# Patient Record
Sex: Female | Born: 1950 | Race: White | Hispanic: No | Marital: Married | Smoking: Never smoker
Health system: Southern US, Community
[De-identification: ages and names within clinical notes are randomized; demographics above are authoritative.]

## PROBLEM LIST (undated history)

## (undated) DIAGNOSIS — E785 Hyperlipidemia, unspecified: Secondary | ICD-10-CM

## (undated) DIAGNOSIS — G250 Essential tremor: Secondary | ICD-10-CM

## (undated) DIAGNOSIS — I4892 Unspecified atrial flutter: Secondary | ICD-10-CM

## (undated) DIAGNOSIS — I429 Cardiomyopathy, unspecified: Secondary | ICD-10-CM

## (undated) DIAGNOSIS — I2510 Atherosclerotic heart disease of native coronary artery without angina pectoris: Secondary | ICD-10-CM

## (undated) DIAGNOSIS — I509 Heart failure, unspecified: Secondary | ICD-10-CM

## (undated) DIAGNOSIS — F329 Major depressive disorder, single episode, unspecified: Secondary | ICD-10-CM

## (undated) DIAGNOSIS — F419 Anxiety disorder, unspecified: Secondary | ICD-10-CM

## (undated) DIAGNOSIS — N6019 Diffuse cystic mastopathy of unspecified breast: Secondary | ICD-10-CM

## (undated) DIAGNOSIS — D649 Anemia, unspecified: Secondary | ICD-10-CM

## (undated) DIAGNOSIS — I4891 Unspecified atrial fibrillation: Secondary | ICD-10-CM

## (undated) DIAGNOSIS — E079 Disorder of thyroid, unspecified: Secondary | ICD-10-CM

## (undated) DIAGNOSIS — E119 Type 2 diabetes mellitus without complications: Secondary | ICD-10-CM

## (undated) DIAGNOSIS — F32A Depression, unspecified: Secondary | ICD-10-CM

## (undated) DIAGNOSIS — I10 Essential (primary) hypertension: Secondary | ICD-10-CM

## (undated) DIAGNOSIS — I219 Acute myocardial infarction, unspecified: Secondary | ICD-10-CM

## (undated) HISTORY — DX: Unspecified atrial flutter: I48.92

## (undated) HISTORY — DX: Anemia, unspecified: D64.9

## (undated) HISTORY — DX: Disorder of thyroid, unspecified: E07.9

## (undated) HISTORY — DX: Essential (primary) hypertension: I10

## (undated) HISTORY — PX: CHOLECYSTECTOMY: SHX55

## (undated) HISTORY — DX: Atherosclerotic heart disease of native coronary artery without angina pectoris: I25.10

## (undated) HISTORY — DX: Major depressive disorder, single episode, unspecified: F32.9

## (undated) HISTORY — PX: CAROTID STENT: SHX1301

## (undated) HISTORY — DX: Hyperlipidemia, unspecified: E78.5

## (undated) HISTORY — DX: Diffuse cystic mastopathy of unspecified breast: N60.19

## (undated) HISTORY — DX: Essential tremor: G25.0

## (undated) HISTORY — DX: Unspecified atrial fibrillation: I48.91

## (undated) HISTORY — DX: Anxiety disorder, unspecified: F41.9

## (undated) HISTORY — PX: OTHER SURGICAL HISTORY: SHX169

## (undated) HISTORY — DX: Type 2 diabetes mellitus without complications: E11.9

## (undated) HISTORY — DX: Heart failure, unspecified: I50.9

## (undated) HISTORY — PX: TONSILLECTOMY: SUR1361

## (undated) HISTORY — DX: Depression, unspecified: F32.A

## (undated) HISTORY — PX: US BREAST LEFT (ARMC HISTORICAL): HXRAD1321

## (undated) HISTORY — DX: Cardiomyopathy, unspecified: I42.9

## (undated) HISTORY — PX: CORONARY ANGIOPLASTY: SHX604

## (undated) HISTORY — PX: INCISION AND DRAINAGE: SHX5863

## (undated) HISTORY — DX: Acute myocardial infarction, unspecified: I21.9

---

## 2003-02-17 HISTORY — PX: BARIATRIC SURGERY: SHX1103

## 2014-04-18 DIAGNOSIS — Z79899 Other long term (current) drug therapy: Secondary | ICD-10-CM | POA: Diagnosis not present

## 2014-04-18 DIAGNOSIS — I10 Essential (primary) hypertension: Secondary | ICD-10-CM | POA: Diagnosis not present

## 2014-04-18 DIAGNOSIS — E1165 Type 2 diabetes mellitus with hyperglycemia: Secondary | ICD-10-CM | POA: Diagnosis not present

## 2014-04-18 DIAGNOSIS — E039 Hypothyroidism, unspecified: Secondary | ICD-10-CM | POA: Diagnosis not present

## 2014-04-18 DIAGNOSIS — E785 Hyperlipidemia, unspecified: Secondary | ICD-10-CM | POA: Diagnosis not present

## 2014-09-26 DIAGNOSIS — Z79899 Other long term (current) drug therapy: Secondary | ICD-10-CM | POA: Diagnosis not present

## 2014-09-26 DIAGNOSIS — E785 Hyperlipidemia, unspecified: Secondary | ICD-10-CM | POA: Diagnosis not present

## 2014-09-26 DIAGNOSIS — I10 Essential (primary) hypertension: Secondary | ICD-10-CM | POA: Diagnosis not present

## 2014-09-26 DIAGNOSIS — E1165 Type 2 diabetes mellitus with hyperglycemia: Secondary | ICD-10-CM | POA: Diagnosis not present

## 2014-09-26 DIAGNOSIS — E039 Hypothyroidism, unspecified: Secondary | ICD-10-CM | POA: Diagnosis not present

## 2014-09-26 DIAGNOSIS — B372 Candidiasis of skin and nail: Secondary | ICD-10-CM | POA: Diagnosis not present

## 2014-11-02 DIAGNOSIS — E119 Type 2 diabetes mellitus without complications: Secondary | ICD-10-CM

## 2014-11-02 DIAGNOSIS — E785 Hyperlipidemia, unspecified: Secondary | ICD-10-CM

## 2014-11-02 DIAGNOSIS — I429 Cardiomyopathy, unspecified: Secondary | ICD-10-CM | POA: Insufficient documentation

## 2014-11-02 DIAGNOSIS — I2510 Atherosclerotic heart disease of native coronary artery without angina pectoris: Secondary | ICD-10-CM | POA: Insufficient documentation

## 2014-11-02 DIAGNOSIS — Z4502 Encounter for adjustment and management of automatic implantable cardiac defibrillator: Secondary | ICD-10-CM

## 2014-11-02 HISTORY — DX: Encounter for adjustment and management of automatic implantable cardiac defibrillator: Z45.02

## 2014-11-02 HISTORY — DX: Atherosclerotic heart disease of native coronary artery without angina pectoris: I25.10

## 2014-11-02 HISTORY — DX: Hyperlipidemia, unspecified: E78.5

## 2014-11-02 HISTORY — DX: Type 2 diabetes mellitus without complications: E11.9

## 2014-12-07 DIAGNOSIS — Z23 Encounter for immunization: Secondary | ICD-10-CM | POA: Diagnosis not present

## 2014-12-19 DIAGNOSIS — S62101A Fracture of unspecified carpal bone, right wrist, initial encounter for closed fracture: Secondary | ICD-10-CM | POA: Diagnosis not present

## 2014-12-21 DIAGNOSIS — S52501A Unspecified fracture of the lower end of right radius, initial encounter for closed fracture: Secondary | ICD-10-CM | POA: Diagnosis not present

## 2015-01-25 DIAGNOSIS — S52571D Other intraarticular fracture of lower end of right radius, subsequent encounter for closed fracture with routine healing: Secondary | ICD-10-CM | POA: Diagnosis not present

## 2015-02-13 DIAGNOSIS — M9902 Segmental and somatic dysfunction of thoracic region: Secondary | ICD-10-CM | POA: Diagnosis not present

## 2015-02-13 DIAGNOSIS — M9901 Segmental and somatic dysfunction of cervical region: Secondary | ICD-10-CM | POA: Diagnosis not present

## 2015-02-13 DIAGNOSIS — M542 Cervicalgia: Secondary | ICD-10-CM | POA: Diagnosis not present

## 2015-03-15 DIAGNOSIS — E039 Hypothyroidism, unspecified: Secondary | ICD-10-CM | POA: Diagnosis not present

## 2015-03-15 DIAGNOSIS — E785 Hyperlipidemia, unspecified: Secondary | ICD-10-CM | POA: Diagnosis not present

## 2015-03-15 DIAGNOSIS — E1165 Type 2 diabetes mellitus with hyperglycemia: Secondary | ICD-10-CM | POA: Diagnosis not present

## 2015-03-15 DIAGNOSIS — Z79899 Other long term (current) drug therapy: Secondary | ICD-10-CM | POA: Diagnosis not present

## 2015-03-15 DIAGNOSIS — I10 Essential (primary) hypertension: Secondary | ICD-10-CM | POA: Diagnosis not present

## 2015-07-16 DIAGNOSIS — Z79899 Other long term (current) drug therapy: Secondary | ICD-10-CM | POA: Diagnosis not present

## 2015-07-16 DIAGNOSIS — I10 Essential (primary) hypertension: Secondary | ICD-10-CM | POA: Diagnosis not present

## 2015-07-16 DIAGNOSIS — E785 Hyperlipidemia, unspecified: Secondary | ICD-10-CM | POA: Diagnosis not present

## 2015-07-16 DIAGNOSIS — E119 Type 2 diabetes mellitus without complications: Secondary | ICD-10-CM | POA: Diagnosis not present

## 2015-07-16 DIAGNOSIS — E039 Hypothyroidism, unspecified: Secondary | ICD-10-CM | POA: Diagnosis not present

## 2015-07-24 DIAGNOSIS — L570 Actinic keratosis: Secondary | ICD-10-CM | POA: Diagnosis not present

## 2015-07-24 DIAGNOSIS — L821 Other seborrheic keratosis: Secondary | ICD-10-CM | POA: Diagnosis not present

## 2015-07-24 DIAGNOSIS — D225 Melanocytic nevi of trunk: Secondary | ICD-10-CM | POA: Diagnosis not present

## 2015-08-27 DIAGNOSIS — E039 Hypothyroidism, unspecified: Secondary | ICD-10-CM | POA: Diagnosis not present

## 2015-10-18 ENCOUNTER — Other Ambulatory Visit: Payer: Self-pay

## 2015-10-25 DIAGNOSIS — Z23 Encounter for immunization: Secondary | ICD-10-CM | POA: Diagnosis not present

## 2015-11-05 DIAGNOSIS — E119 Type 2 diabetes mellitus without complications: Secondary | ICD-10-CM | POA: Diagnosis not present

## 2015-11-05 DIAGNOSIS — E785 Hyperlipidemia, unspecified: Secondary | ICD-10-CM | POA: Diagnosis not present

## 2015-11-05 DIAGNOSIS — I255 Ischemic cardiomyopathy: Secondary | ICD-10-CM | POA: Diagnosis not present

## 2015-11-05 DIAGNOSIS — Z4502 Encounter for adjustment and management of automatic implantable cardiac defibrillator: Secondary | ICD-10-CM | POA: Diagnosis not present

## 2015-11-05 DIAGNOSIS — I2510 Atherosclerotic heart disease of native coronary artery without angina pectoris: Secondary | ICD-10-CM | POA: Diagnosis not present

## 2015-11-12 DIAGNOSIS — E039 Hypothyroidism, unspecified: Secondary | ICD-10-CM | POA: Diagnosis not present

## 2015-11-12 DIAGNOSIS — E785 Hyperlipidemia, unspecified: Secondary | ICD-10-CM | POA: Diagnosis not present

## 2015-11-12 DIAGNOSIS — I10 Essential (primary) hypertension: Secondary | ICD-10-CM | POA: Diagnosis not present

## 2015-11-12 DIAGNOSIS — D539 Nutritional anemia, unspecified: Secondary | ICD-10-CM | POA: Diagnosis not present

## 2015-11-12 DIAGNOSIS — Z79899 Other long term (current) drug therapy: Secondary | ICD-10-CM | POA: Diagnosis not present

## 2015-11-12 DIAGNOSIS — E119 Type 2 diabetes mellitus without complications: Secondary | ICD-10-CM | POA: Diagnosis not present

## 2015-11-19 DIAGNOSIS — I255 Ischemic cardiomyopathy: Secondary | ICD-10-CM | POA: Diagnosis not present

## 2015-11-19 DIAGNOSIS — E119 Type 2 diabetes mellitus without complications: Secondary | ICD-10-CM | POA: Diagnosis not present

## 2015-11-19 DIAGNOSIS — I2510 Atherosclerotic heart disease of native coronary artery without angina pectoris: Secondary | ICD-10-CM | POA: Diagnosis not present

## 2015-11-19 DIAGNOSIS — E785 Hyperlipidemia, unspecified: Secondary | ICD-10-CM | POA: Diagnosis not present

## 2015-11-19 DIAGNOSIS — Z4502 Encounter for adjustment and management of automatic implantable cardiac defibrillator: Secondary | ICD-10-CM | POA: Diagnosis not present

## 2015-11-22 DIAGNOSIS — E119 Type 2 diabetes mellitus without complications: Secondary | ICD-10-CM | POA: Diagnosis not present

## 2015-12-17 DIAGNOSIS — Z4502 Encounter for adjustment and management of automatic implantable cardiac defibrillator: Secondary | ICD-10-CM | POA: Diagnosis not present

## 2015-12-17 DIAGNOSIS — I2510 Atherosclerotic heart disease of native coronary artery without angina pectoris: Secondary | ICD-10-CM | POA: Diagnosis not present

## 2015-12-17 DIAGNOSIS — E119 Type 2 diabetes mellitus without complications: Secondary | ICD-10-CM | POA: Diagnosis not present

## 2015-12-17 DIAGNOSIS — E785 Hyperlipidemia, unspecified: Secondary | ICD-10-CM | POA: Diagnosis not present

## 2015-12-17 DIAGNOSIS — I255 Ischemic cardiomyopathy: Secondary | ICD-10-CM | POA: Diagnosis not present

## 2016-03-20 DIAGNOSIS — R05 Cough: Secondary | ICD-10-CM | POA: Diagnosis not present

## 2016-03-20 DIAGNOSIS — E119 Type 2 diabetes mellitus without complications: Secondary | ICD-10-CM | POA: Diagnosis not present

## 2016-03-20 DIAGNOSIS — E785 Hyperlipidemia, unspecified: Secondary | ICD-10-CM | POA: Diagnosis not present

## 2016-03-20 DIAGNOSIS — Z23 Encounter for immunization: Secondary | ICD-10-CM | POA: Diagnosis not present

## 2016-03-20 DIAGNOSIS — E611 Iron deficiency: Secondary | ICD-10-CM | POA: Diagnosis not present

## 2016-03-20 DIAGNOSIS — Z79899 Other long term (current) drug therapy: Secondary | ICD-10-CM | POA: Diagnosis not present

## 2016-03-20 DIAGNOSIS — E039 Hypothyroidism, unspecified: Secondary | ICD-10-CM | POA: Diagnosis not present

## 2016-03-20 DIAGNOSIS — I10 Essential (primary) hypertension: Secondary | ICD-10-CM | POA: Diagnosis not present

## 2016-04-14 DIAGNOSIS — Z1231 Encounter for screening mammogram for malignant neoplasm of breast: Secondary | ICD-10-CM | POA: Diagnosis not present

## 2016-04-14 DIAGNOSIS — R928 Other abnormal and inconclusive findings on diagnostic imaging of breast: Secondary | ICD-10-CM | POA: Diagnosis not present

## 2016-05-18 DIAGNOSIS — I2510 Atherosclerotic heart disease of native coronary artery without angina pectoris: Secondary | ICD-10-CM | POA: Diagnosis not present

## 2016-05-18 DIAGNOSIS — E119 Type 2 diabetes mellitus without complications: Secondary | ICD-10-CM | POA: Diagnosis not present

## 2016-05-18 DIAGNOSIS — Z95810 Presence of automatic (implantable) cardiac defibrillator: Secondary | ICD-10-CM

## 2016-05-18 DIAGNOSIS — E785 Hyperlipidemia, unspecified: Secondary | ICD-10-CM | POA: Diagnosis not present

## 2016-05-18 DIAGNOSIS — I255 Ischemic cardiomyopathy: Secondary | ICD-10-CM | POA: Diagnosis not present

## 2016-05-18 HISTORY — DX: Presence of automatic (implantable) cardiac defibrillator: Z95.810

## 2016-05-21 DIAGNOSIS — N6489 Other specified disorders of breast: Secondary | ICD-10-CM | POA: Diagnosis not present

## 2016-05-21 DIAGNOSIS — R928 Other abnormal and inconclusive findings on diagnostic imaging of breast: Secondary | ICD-10-CM | POA: Diagnosis not present

## 2016-05-21 DIAGNOSIS — N6311 Unspecified lump in the right breast, upper outer quadrant: Secondary | ICD-10-CM | POA: Diagnosis not present

## 2016-06-12 DIAGNOSIS — L814 Other melanin hyperpigmentation: Secondary | ICD-10-CM | POA: Diagnosis not present

## 2016-06-12 DIAGNOSIS — R233 Spontaneous ecchymoses: Secondary | ICD-10-CM | POA: Diagnosis not present

## 2016-06-12 DIAGNOSIS — D2239 Melanocytic nevi of other parts of face: Secondary | ICD-10-CM | POA: Diagnosis not present

## 2016-06-12 DIAGNOSIS — L570 Actinic keratosis: Secondary | ICD-10-CM | POA: Diagnosis not present

## 2016-06-12 DIAGNOSIS — D225 Melanocytic nevi of trunk: Secondary | ICD-10-CM | POA: Diagnosis not present

## 2016-07-20 DIAGNOSIS — E785 Hyperlipidemia, unspecified: Secondary | ICD-10-CM | POA: Diagnosis not present

## 2016-07-20 DIAGNOSIS — J029 Acute pharyngitis, unspecified: Secondary | ICD-10-CM | POA: Diagnosis not present

## 2016-07-20 DIAGNOSIS — Z79899 Other long term (current) drug therapy: Secondary | ICD-10-CM | POA: Diagnosis not present

## 2016-07-20 DIAGNOSIS — E119 Type 2 diabetes mellitus without complications: Secondary | ICD-10-CM | POA: Diagnosis not present

## 2016-08-31 DIAGNOSIS — R21 Rash and other nonspecific skin eruption: Secondary | ICD-10-CM | POA: Diagnosis not present

## 2016-08-31 DIAGNOSIS — E039 Hypothyroidism, unspecified: Secondary | ICD-10-CM | POA: Diagnosis not present

## 2016-08-31 DIAGNOSIS — I5020 Unspecified systolic (congestive) heart failure: Secondary | ICD-10-CM | POA: Diagnosis not present

## 2016-08-31 DIAGNOSIS — I10 Essential (primary) hypertension: Secondary | ICD-10-CM | POA: Diagnosis not present

## 2016-08-31 DIAGNOSIS — E119 Type 2 diabetes mellitus without complications: Secondary | ICD-10-CM | POA: Diagnosis not present

## 2016-08-31 DIAGNOSIS — R05 Cough: Secondary | ICD-10-CM | POA: Diagnosis not present

## 2016-11-04 DIAGNOSIS — R05 Cough: Secondary | ICD-10-CM | POA: Diagnosis not present

## 2016-11-04 DIAGNOSIS — E039 Hypothyroidism, unspecified: Secondary | ICD-10-CM | POA: Diagnosis not present

## 2016-11-04 DIAGNOSIS — I5020 Unspecified systolic (congestive) heart failure: Secondary | ICD-10-CM | POA: Diagnosis not present

## 2016-11-04 DIAGNOSIS — I10 Essential (primary) hypertension: Secondary | ICD-10-CM | POA: Diagnosis not present

## 2016-11-04 DIAGNOSIS — Z79899 Other long term (current) drug therapy: Secondary | ICD-10-CM | POA: Diagnosis not present

## 2016-11-04 DIAGNOSIS — R799 Abnormal finding of blood chemistry, unspecified: Secondary | ICD-10-CM | POA: Diagnosis not present

## 2016-11-04 DIAGNOSIS — E785 Hyperlipidemia, unspecified: Secondary | ICD-10-CM | POA: Diagnosis not present

## 2016-11-18 ENCOUNTER — Ambulatory Visit: Payer: Medicare Other | Admitting: Cardiology

## 2016-12-15 ENCOUNTER — Encounter: Payer: Self-pay | Admitting: Cardiology

## 2016-12-15 ENCOUNTER — Ambulatory Visit (INDEPENDENT_AMBULATORY_CARE_PROVIDER_SITE_OTHER): Payer: Medicare Other | Admitting: Cardiology

## 2016-12-15 VITALS — BP 122/70 | HR 92 | Resp 17 | Ht 63.0 in | Wt 201.0 lb

## 2016-12-15 DIAGNOSIS — I255 Ischemic cardiomyopathy: Secondary | ICD-10-CM

## 2016-12-15 DIAGNOSIS — I2510 Atherosclerotic heart disease of native coronary artery without angina pectoris: Secondary | ICD-10-CM | POA: Diagnosis not present

## 2016-12-15 DIAGNOSIS — Z95810 Presence of automatic (implantable) cardiac defibrillator: Secondary | ICD-10-CM

## 2016-12-15 DIAGNOSIS — E785 Hyperlipidemia, unspecified: Secondary | ICD-10-CM | POA: Diagnosis not present

## 2016-12-15 DIAGNOSIS — Z794 Long term (current) use of insulin: Secondary | ICD-10-CM

## 2016-12-15 DIAGNOSIS — E119 Type 2 diabetes mellitus without complications: Secondary | ICD-10-CM

## 2016-12-15 MED ORDER — TELMISARTAN 40 MG PO TABS: 40.0000 mg | ORAL_TABLET | Freq: Every day | ORAL | 6 refills | Status: DC

## 2016-12-15 MED ORDER — LOSARTAN POTASSIUM 25 MG PO TABS: 25.0000 mg | ORAL_TABLET | Freq: Every day | ORAL | 11 refills | Status: DC

## 2016-12-15 NOTE — Patient Instructions (Signed)
Medication Instructions:  Your physician has recommended you make the following change in your medication:  1.) START Losartan 25 mg daily.   1. Avoid all over-the-counter antihistamines except Claritin/Loratadine and Zyrtec/Cetrizine. 2. Avoid all combination including cold sinus allergies flu decongestant and sleep medications 3. You can use Robitussin DM Mucinex and Mucinex DM for cough. 4. can use Tylenol aspirin ibuprofen and naproxen but no combinations such as sleep or sinus.  Labwork: Your physician recommends that you have lab work today to check your cholesterol and Kidney function as well as sodium level and potassium.   Testing/Procedures: None   Follow-Up: Your physician recommends that you schedule a follow-up appointment in: 2-3 months   Any Other Special Instructions Will Be Listed Below (If Applicable).  Please note that any paperwork needing to be filled out by the provider will need to be addressed at the front desk prior to seeing the provider. Please note that any paperwork FMLA, Disability or other documents regarding health condition is subject to a $25.00 charge that must be received prior to completion of paperwork in the form of a money order or check.    If you need a refill on your cardiac medications before your next appointment, please call your pharmacy.

## 2016-12-16 LAB — BASIC METABOLIC PANEL
BUN/Creatinine Ratio: 20 (ref 12–28)
BUN: 25 mg/dL (ref 8–27)
CALCIUM: 9.4 mg/dL (ref 8.7–10.3)
CO2: 27 mmol/L (ref 20–29)
CREATININE: 1.27 mg/dL — AB (ref 0.57–1.00)
Chloride: 98 mmol/L (ref 96–106)
GFR calc Af Amer: 50 mL/min/{1.73_m2} — ABNORMAL LOW (ref >59)
GFR, EST NON AFRICAN AMERICAN: 44 mL/min/{1.73_m2} — AB (ref >59)
GLUCOSE: 157 mg/dL — AB (ref 65–99)
POTASSIUM: 5.2 mmol/L (ref 3.5–5.2)
Sodium: 139 mmol/L (ref 134–144)

## 2016-12-16 NOTE — Progress Notes (Signed)
Cardiology Office Note:    Date:  12/16/2016   ID:  Christina Perry, DOB 09/04/1950, MRN [id number]  PCP:  Ernestene Kiel, MD  Cardiologist:  Jenne Campus, MD    Referring MD: Ernestene Kiel, MD   Chief Complaint  Patient presents with  . Follow-up    routine follow up   Doing well  History of Present Illness:    Christina Perry is a 66 y.o. female with history of ischemic cardiomyopathy she did have myocardial infarction at age of 25.  She seems to be doing well.  Denies having any chest pains tightness squeezing pressure burning chest.  She comes for regular follow-up.  She still works and had no difficulty doing it.  She still have some difficulty controlling her diabetes and being always a problem with it.  Does not exercise on a regular basis  Past Medical History:  Diagnosis Date  . Anxiety   . Cardiomyopathy ([place])   . Coronary artery stenosis   . Depression   . Diabetes mellitus without complication ([place])   . Hyperlipidemia   . Hypertension   . MI (myocardial infarction) ([place])   . Thyroid disease     Past Surgical History:  Procedure Laterality Date  . CAROTID STENT    . CESAREAN SECTION    . CORONARY ANGIOPLASTY    . ICD Placement      Current Medications: Current Meds  Medication Sig  . aspirin (GOODSENSE ASPIRIN) 325 MG tablet Take 325 mg by mouth daily.  Marland Kitchen atorvastatin (LIPITOR) 20 MG tablet Take 20 mg by mouth daily.  . carvedilol (COREG) 12.5 MG tablet Take 6.25 mg by mouth 2 (two) times daily.  . furosemide (LASIX) 40 MG tablet Take 20 mg by mouth as needed. 20mg  Mon and Thursday   . glimepiride (AMARYL) 4 MG tablet Take 4 mg by mouth daily.  Marland Kitchen levothyroxine (SYNTHROID, LEVOTHROID) 137 MCG tablet Take 137 mcg by mouth daily.  . Magnesium 500 MG TABS Take 500 mg by mouth 2 (two) times daily.  . Melatonin 3 MG TABS Take 1 tablet by mouth as needed for sleep.  . metFORMIN (GLUCOPHAGE) 1000 MG tablet Take 1,000 mg by mouth 2 (two) times daily.    . montelukast (SINGULAIR) 10 MG tablet   . Multiple Vitamin (MULTI-VITAMINS) TABS Take 1 tablet by mouth daily.  Marland Kitchen olmesartan (BENICAR) 20 MG tablet Take 20 mg by mouth daily.  Marland Kitchen PARoxetine (PAXIL) 20 MG tablet Take 20 mg by mouth daily.  . vitamin E 400 UNIT capsule Take 1 capsule by mouth daily.     Allergies:   Patient has no allergy information on record.   Social History   Social History  . Marital status: Married    Spouse name: N/A  . Number of children: N/A  . Years of education: N/A   Social History Main Topics  . Smoking status: Never Smoker  . Smokeless tobacco: Never Used  . Alcohol use Yes  . Drug use: No  . Sexual activity: Not Asked   Other Topics Concern  . None   Social History Narrative  . None     Family History: The patient's family history includes CAD in her mother; COPD in her mother; Diabetes in her mother; Stroke in her mother. ROS:   Please see the history of present illness.    All 14 point review of systems negative except as described per history of present illness  EKGs/Labs/Other Studies Reviewed:  Recent Labs: 12/15/2016: BUN 25; Creatinine, Ser 1.27; Potassium 5.2; Sodium 139  Recent Lipid Panel No results found for: CHOL, TRIG, HDL, CHOLHDL, VLDL, LDLCALC, LDLDIRECT  Physical Exam:    VS:  BP 122/70 (BP Location: Right Arm, Patient Position: Sitting, Cuff Size: Large)   Pulse 92   Resp 17   Ht 5\' 3"  (1.6 m)   Wt 201 lb (91.2 kg)   SpO2 96%   BMI 35.61 kg/m     Wt Readings from Last 3 Encounters:  12/15/16 201 lb (91.2 kg)     GEN:  Well nourished, well developed in no acute distress HEENT: Normal NECK: No JVD; No carotid bruits LYMPHATICS: No lymphadenopathy CARDIAC: RRR, no murmurs, no rubs, no gallops RESPIRATORY:  Clear to auscultation without rales, wheezing or rhonchi  ABDOMEN: Soft, non-tender, non-distended MUSCULOSKELETAL:  No edema; No deformity  SKIN: Warm and dry LOWER EXTREMITIES: no  swelling NEUROLOGIC:  Alert and oriented x 3 PSYCHIATRIC:  Normal affect   ASSESSMENT:    1. Coronary artery disease involving native coronary artery of native heart without angina pectoris   2. Ischemic cardiomyopathy   3. Type 2 diabetes mellitus without complication, with long-term current use of insulin ([place])   4. Dyslipidemia   5. ICD (implantable cardioverter-defibrillator) in place    PLAN:    In order of problems listed above:  1. Coronary artery disease: Stable, asymptomatic we will continue present management. 2. Ischemic cardiomyopathy: She is on beta-blocker however she stopped taking her ARB.  She is unable to tolerate ACE inhibitor.  We will try to start a different ARB because she wants to try Micardis.  He may be candidate for Entresto 3. Type 2 diabetes: Always difficult to control.  She is trying to do better.  Followed by internal medicine team. 4. Dyslipidemia: On statin which I will continue. 5. ICD present: Battery status getting low will contact company to see if it is time to talk about replacement.   Medication Adjustments/Labs and Tests Ordered: Current medicines are reviewed at length with the patient today.  Concerns regarding medicines are outlined above.  Orders Placed This Encounter  Procedures  . Basic metabolic panel   Medication changes:  Meds ordered this encounter  Medications  . DISCONTD: losartan (COZAAR) 25 MG tablet    Sig: Take 1 tablet (25 mg total) by mouth daily.    Dispense:  30 tablet    Refill:  11  . telmisartan (MICARDIS) 40 MG tablet    Sig: Take 1 tablet (40 mg total) by mouth daily.    Dispense:  30 tablet    Refill:  6    Signed, Park Liter, MD, [facility] 12/16/2016 8:25 AM    [place]

## 2016-12-29 ENCOUNTER — Other Ambulatory Visit: Payer: Self-pay | Admitting: Cardiology

## 2017-01-06 ENCOUNTER — Encounter: Payer: Self-pay | Admitting: Cardiology

## 2017-01-06 ENCOUNTER — Ambulatory Visit (INDEPENDENT_AMBULATORY_CARE_PROVIDER_SITE_OTHER): Payer: Medicare Other | Admitting: Cardiology

## 2017-01-06 VITALS — BP 128/79 | HR 60 | Ht 63.0 in | Wt 204.4 lb

## 2017-01-06 DIAGNOSIS — I255 Ischemic cardiomyopathy: Secondary | ICD-10-CM | POA: Diagnosis not present

## 2017-01-06 DIAGNOSIS — I2510 Atherosclerotic heart disease of native coronary artery without angina pectoris: Secondary | ICD-10-CM

## 2017-01-06 DIAGNOSIS — E785 Hyperlipidemia, unspecified: Secondary | ICD-10-CM | POA: Diagnosis not present

## 2017-01-06 DIAGNOSIS — I10 Essential (primary) hypertension: Secondary | ICD-10-CM | POA: Diagnosis not present

## 2017-01-06 DIAGNOSIS — Z79899 Other long term (current) drug therapy: Secondary | ICD-10-CM | POA: Diagnosis not present

## 2017-01-06 NOTE — Addendum Note (Signed)
Addended by: [place] Kidney on: 01/06/2017 03:52 PM   Modules accepted: Orders

## 2017-01-06 NOTE — Patient Instructions (Signed)
Medication Instructions:  Your physician recommends that you continue on your current medications as directed. Please refer to the Current Medication list given to you today.  * If you need a refill on your cardiac medications before your next appointment, please call your pharmacy. *  Labwork: bmet today  Testing/Procedures: None ordered  Follow-Up: Call the office when your device notifies you that it has reached end of battery life Call [phone]  Thank you for choosing [facility]!!   Trinidad Curet, RN [phone]  Any Other Special Instructions Will Be Listed Below (If Applicable).

## 2017-01-06 NOTE — Progress Notes (Signed)
Electrophysiology Office Note   Date:  01/06/2017   ID:  Christina Perry, DOB 19-Dec-1950, MRN [id number]  PCP:  Ernestene Kiel, MD  Cardiologist:  Agustin Cree Primary Electrophysiologist:  Constance Haw, MD    Chief Complaint  Patient presents with  . Defib Check    Ischemic cardiomyopathy     History of Present Illness: Christina Perry is a 66 y.o. female who is being seen today for the evaluation of ischemic cardiomyopathy at the request of Ernestene Kiel, MD. Presenting today for electrophysiology evaluation.  She has a history of an ischemic cardiomyopathy with a myocardial infarction at the age of 32, type 2 diabetes, hypertension, and hyperlipidemia.  She presents for evaluation of her ICD.    Today, she denies symptoms of palpitations, chest pain, shortness of breath, orthopnea, PND, lower extremity edema, claudication, dizziness, presyncope, syncope, bleeding, or neurologic sequela. The patient is tolerating medications without difficulties.  He currently feels well without major complaint.  Her ICD is close to RRT.  She does not currently do remote checks.   Past Medical History:  Diagnosis Date  . Anxiety   . Cardiomyopathy ([place])   . Coronary artery stenosis   . Depression   . Diabetes mellitus without complication ([place])   . Hyperlipidemia   . Hypertension   . MI (myocardial infarction) ([place])   . Thyroid disease    Past Surgical History:  Procedure Laterality Date  . CAROTID STENT    . CESAREAN SECTION    . CORONARY ANGIOPLASTY    . ICD Placement       Current Outpatient Medications  Medication Sig Dispense Refill  . aspirin (GOODSENSE ASPIRIN) 325 MG tablet Take 325 mg by mouth daily.    Marland Kitchen atorvastatin (LIPITOR) 20 MG tablet Take 20 mg by mouth daily.    . carvedilol (COREG) 12.5 MG tablet Take 12.5 mg by mouth 2 (two) times daily.     . furosemide (LASIX) 40 MG tablet Take 20 mg by mouth as needed. 20mg  Mon and Thursday     . glimepiride  (AMARYL) 4 MG tablet Take 4 mg by mouth daily.    Marland Kitchen levothyroxine (SYNTHROID, LEVOTHROID) 137 MCG tablet Take 137 mcg by mouth daily.    . Magnesium 500 MG TABS Take 500 mg by mouth 2 (two) times daily.    . Melatonin 3 MG TABS Take 1 tablet by mouth as needed for sleep.    . metFORMIN (GLUCOPHAGE) 1000 MG tablet Take 1,000 mg by mouth 2 (two) times daily.    . montelukast (SINGULAIR) 10 MG tablet Take 10 mg by mouth at bedtime.     . Multiple Vitamin (MULTI-VITAMINS) TABS Take 1 tablet by mouth daily.    Marland Kitchen PARoxetine (PAXIL) 20 MG tablet Take 20 mg by mouth daily.    . Telmisartan (MICARDIS PO) Take 10 mg by mouth daily.    . vitamin E 400 UNIT capsule Take 1 capsule by mouth daily.     No current facility-administered medications for this visit.     Allergies:   Patient has no allergy information on record.   Social History:  The patient  reports that  has never smoked. she has never used smokeless tobacco. She reports that she drinks alcohol. She reports that she does not use drugs.   Family History:  The patient's family history includes CAD in her mother; COPD in her mother; Diabetes in her mother; Stroke in her mother.    ROS:  Please see  the history of present illness.   Otherwise, review of systems is positive for none.   All other systems are reviewed and negative.    PHYSICAL EXAM: VS:  BP 128/79   Pulse 60   Ht 5\' 3"  (1.6 m)   Wt 204 lb 6.4 oz (92.7 kg)   BMI 36.21 kg/m  , BMI Body mass index is 36.21 kg/m. GEN: Well nourished, well developed, in no acute distress  HEENT: normal  Neck: no JVD, carotid bruits, or masses Cardiac: RRR; no murmurs, rubs, or gallops,no edema  Respiratory:  clear to auscultation bilaterally, normal work of breathing GI: soft, nontender, nondistended, + BS MS: no deformity or atrophy  Skin: warm and dry,  device pocket is well healed Neuro:  Strength and sensation are intact Psych: euthymic mood, full affect  EKG:  EKG is ordered  today. Personal review of the ekg ordered shows A paced, LAD, rate 60   Device interrogation is reviewed today in detail.  See PaceArt for details.   Recent Labs: 12/15/2016: BUN 25; Creatinine, Ser 1.27; Potassium 5.2; Sodium 139    Lipid Panel  No results found for: CHOL, TRIG, HDL, CHOLHDL, VLDL, LDLCALC, LDLDIRECT   Wt Readings from Last 3 Encounters:  01/06/17 204 lb 6.4 oz (92.7 kg)  12/15/16 201 lb (91.2 kg)      Other studies Reviewed: Additional studies/ records that were reviewed today include: Epic notes  ASSESSMENT AND PLAN:  1.  Systolic heart failure chronic due to ischemic cardiomyopathy: Currently on optimal medical therapy.  Is status post Medtronic ICD.  She is close to needing a generator change.  She does not do remote checks.  She was made aware of the alert tone on her device and Elexus Barman call us when it goes off to schedule.  2.  Hypertension: Currently well controlled.  No changes.  3.  Hyperlipidemia: Continue statin  4.  Coronary artery disease: Has been stable.  Is asymptomatic.  We Maveryk Renstrom continue current management.  Current medicines are reviewed at length with the patient today.   The patient does not have concerns regarding her medicines.  The following changes were made today:  none  Labs/ tests ordered today include:  Orders Placed This Encounter  Procedures  . Basic Metabolic Panel (BMET)  . EKG 12-Lead     Disposition:   FU with Khaliya Golinski 6 months  Signed, Theodoro Koval Christina Leeds, MD  01/06/2017 3:44 PM     [facility] [address] [place] [address] [phone] (office) [phone] (fax)

## 2017-01-07 LAB — BASIC METABOLIC PANEL
BUN / CREAT RATIO: 16 (ref 12–28)
BUN: 19 mg/dL (ref 8–27)
CHLORIDE: 104 mmol/L (ref 96–106)
CO2: 26 mmol/L (ref 20–29)
Calcium: 9.2 mg/dL (ref 8.7–10.3)
Creatinine, Ser: 1.22 mg/dL — ABNORMAL HIGH (ref 0.57–1.00)
GFR calc non Af Amer: 46 mL/min/{1.73_m2} — ABNORMAL LOW (ref >59)
GFR, EST AFRICAN AMERICAN: 53 mL/min/{1.73_m2} — AB (ref >59)
GLUCOSE: 102 mg/dL — AB (ref 65–99)
Potassium: 5.4 mmol/L — ABNORMAL HIGH (ref 3.5–5.2)
SODIUM: 142 mmol/L (ref 134–144)

## 2017-01-11 ENCOUNTER — Institutional Professional Consult (permissible substitution): Payer: Medicare Other | Admitting: Cardiology

## 2017-01-21 DIAGNOSIS — Z79899 Other long term (current) drug therapy: Secondary | ICD-10-CM | POA: Diagnosis not present

## 2017-02-10 ENCOUNTER — Other Ambulatory Visit: Payer: Self-pay

## 2017-02-17 MED ORDER — TELMISARTAN 40 MG PO TABS: 40.0000 mg | ORAL_TABLET | Freq: Every day | ORAL | 6 refills | Status: DC

## 2017-02-18 DIAGNOSIS — J40 Bronchitis, not specified as acute or chronic: Secondary | ICD-10-CM | POA: Diagnosis not present

## 2017-02-18 DIAGNOSIS — R05 Cough: Secondary | ICD-10-CM | POA: Diagnosis not present

## 2017-02-23 DIAGNOSIS — R05 Cough: Secondary | ICD-10-CM | POA: Diagnosis not present

## 2017-04-06 ENCOUNTER — Ambulatory Visit: Payer: Medicare Other | Admitting: Cardiology

## 2017-04-13 ENCOUNTER — Ambulatory Visit (INDEPENDENT_AMBULATORY_CARE_PROVIDER_SITE_OTHER): Payer: Medicare Other | Admitting: Cardiology

## 2017-04-13 ENCOUNTER — Encounter: Payer: Self-pay | Admitting: Cardiology

## 2017-04-13 VITALS — BP 130/68 | HR 58 | Ht 63.0 in | Wt 202.0 lb

## 2017-04-13 DIAGNOSIS — E119 Type 2 diabetes mellitus without complications: Secondary | ICD-10-CM | POA: Diagnosis not present

## 2017-04-13 DIAGNOSIS — Z95810 Presence of automatic (implantable) cardiac defibrillator: Secondary | ICD-10-CM | POA: Diagnosis not present

## 2017-04-13 DIAGNOSIS — I255 Ischemic cardiomyopathy: Secondary | ICD-10-CM

## 2017-04-13 DIAGNOSIS — E785 Hyperlipidemia, unspecified: Secondary | ICD-10-CM | POA: Diagnosis not present

## 2017-04-13 DIAGNOSIS — I2510 Atherosclerotic heart disease of native coronary artery without angina pectoris: Secondary | ICD-10-CM | POA: Diagnosis not present

## 2017-04-13 DIAGNOSIS — Z794 Long term (current) use of insulin: Secondary | ICD-10-CM | POA: Diagnosis not present

## 2017-04-13 MED ORDER — SACUBITRIL-VALSARTAN 49-51 MG PO TABS: 1.0000 | ORAL_TABLET | Freq: Two times a day (BID) | ORAL | 3 refills | Status: DC

## 2017-04-13 NOTE — Addendum Note (Signed)
Addended by: Kathyrn Sheriff on: 04/13/2017 10:29 AM   Modules accepted: Orders

## 2017-04-13 NOTE — Progress Notes (Signed)
Cardiology Office Note:    Date:  04/13/2017   ID:  Christina Perry, DOB Apr 17, 1950, MRN [id number]  PCP:  Ernestene Kiel, MD  Cardiologist:  Jenne Campus, MD    Referring MD: Ernestene Kiel, MD   Chief Complaint  Patient presents with  . Follow-up  Doing well  History of Present Illness:    Christina Perry is a 67 y.o. female with ischemic cardiomyopathy.  Last echocardiogram at the end of 2017.  Denies have any chest pain tightness squeezing pressure in chest.  No discharges from the defibrillator.  Past Medical History:  Diagnosis Date  . Anxiety   . Cardiomyopathy ([place])   . Coronary artery stenosis   . Depression   . Diabetes mellitus without complication ([place])   . Hyperlipidemia   . Hypertension   . MI (myocardial infarction) ([place])   . Thyroid disease     Past Surgical History:  Procedure Laterality Date  . CAROTID STENT    . CESAREAN SECTION    . CORONARY ANGIOPLASTY    . ICD Placement      Current Medications: Current Meds  Medication Sig  . aspirin (GOODSENSE ASPIRIN) 325 MG tablet Take 325 mg by mouth daily.  Marland Kitchen atorvastatin (LIPITOR) 20 MG tablet Take 20 mg by mouth daily.  . carvedilol (COREG) 12.5 MG tablet Take 12.5 mg by mouth 2 (two) times daily.   . furosemide (LASIX) 40 MG tablet Take 20 mg by mouth as needed. 20mg  Mon and Thursday   . glimepiride (AMARYL) 4 MG tablet Take 4 mg by mouth daily.  Marland Kitchen levothyroxine (SYNTHROID, LEVOTHROID) 137 MCG tablet Take 137 mcg by mouth daily.  . Magnesium 500 MG TABS Take 1,000 mg by mouth 2 (two) times daily.   . Melatonin 3 MG TABS Take 1 tablet by mouth as needed for sleep.  . metFORMIN (GLUCOPHAGE) 1000 MG tablet Take 1,000 mg by mouth 2 (two) times daily.  . montelukast (SINGULAIR) 10 MG tablet Take 10 mg by mouth at bedtime.   . Multiple Vitamin (MULTI-VITAMINS) TABS Take 1 tablet by mouth daily.  Marland Kitchen PARoxetine (PAXIL) 20 MG tablet Take 20 mg by mouth daily.  Marland Kitchen telmisartan (MICARDIS) 40 MG tablet  Take 1 tablet (40 mg total) by mouth daily.  . vitamin E 400 UNIT capsule Take 1 capsule by mouth daily.     Allergies:   Patient has no known allergies.   Social History   Socioeconomic History  . Marital status: Married    Spouse name: None  . Number of children: None  . Years of education: None  . Highest education level: None  Social Needs  . Financial resource strain: None  . Food insecurity - worry: None  . Food insecurity - inability: None  . Transportation needs - medical: None  . Transportation needs - non-medical: None  Occupational History  . None  Tobacco Use  . Smoking status: Never Smoker  . Smokeless tobacco: Never Used  Substance and Sexual Activity  . Alcohol use: Yes  . Drug use: No  . Sexual activity: None  Other Topics Concern  . None  Social History Narrative  . None     Family History: The patient's family history includes CAD in her mother; COPD in her mother; Diabetes in her mother; Stroke in her mother. ROS:   Please see the history of present illness.    All 14 point review of systems negative except as described per history of present illness  EKGs/Labs/Other Studies  Reviewed:      Recent Labs: 01/06/2017: BUN 19; Creatinine, Ser 1.22; Potassium 5.4; Sodium 142  Recent Lipid Panel No results found for: CHOL, TRIG, HDL, CHOLHDL, VLDL, LDLCALC, LDLDIRECT  Physical Exam:    VS:  BP 130/68   Pulse (!) 58   Ht 5\' 3"  (1.6 m)   Wt 202 lb (91.6 kg)   SpO2 97%   BMI 35.78 kg/m     Wt Readings from Last 3 Encounters:  04/13/17 202 lb (91.6 kg)  01/06/17 204 lb 6.4 oz (92.7 kg)  12/15/16 201 lb (91.2 kg)     GEN:  Well nourished, well developed in no acute distress HEENT: Normal NECK: No JVD; No carotid bruits LYMPHATICS: No lymphadenopathy CARDIAC: RRR, no murmurs, no rubs, no gallops RESPIRATORY:  Clear to auscultation without rales, wheezing or rhonchi  ABDOMEN: Soft, non-tender, non-distended MUSCULOSKELETAL:  No edema; No  deformity  SKIN: Warm and dry LOWER EXTREMITIES: no swelling NEUROLOGIC:  Alert and oriented x 3 PSYCHIATRIC:  Normal affect   ASSESSMENT:    1. Ischemic cardiomyopathy   2. Coronary artery disease involving native coronary artery of native heart without angina pectoris   3. Type 2 diabetes mellitus without complication, with long-term current use of insulin ([place])   4. Dyslipidemia   5. ICD (implantable cardioverter-defibrillator) in place    PLAN:    In order of problems listed above:  1. Ischemic cardia myopathy: I will ask her to have repeated echocardiogram.  I will also ask you to stop Micardis and go on Entresto 49/51 twice daily.  If she is able to tolerate it we will continue if not we will switch her back to Micardis.  She is already on beta-blocker which I will continue. 2. Type 2 diabetes.  Apparently stable.  She does not check her sugar however she tells me that her A1c is between 6.5 and 7.1. 3. Dyslipidemia: On high intensity statin which I will continue. 4. ICD: Followed by our EP team.  Apparently she is close to ERI.  She was told that she will hears some audible tone when they be time to replace her defibrillator   Medication Adjustments/Labs and Tests Ordered: Current medicines are reviewed at length with the patient today.  Concerns regarding medicines are outlined above.  No orders of the defined types were placed in this encounter.  Medication changes: No orders of the defined types were placed in this encounter.   Signed, Park Liter, MD, [facility] 04/13/2017 10:14 AM    [place]

## 2017-04-13 NOTE — Patient Instructions (Signed)
Medication Instructions:  Your physician has recommended you make the following change in your medication:  1.) STOP: Micardis 2.) START: Entresto 49/51 (Take 1 tablet by mouth twice daily)  Labwork: None   Testing/Procedures: Your physician has requested that you have an echocardiogram. Echocardiography is a painless test that uses sound waves to create images of your heart. It provides your doctor with information about the size and shape of your heart and how well your heart's chambers and valves are working. This procedure takes approximately one hour. There are no restrictions for this procedure.   Follow-Up: Your physician wants you to follow-up in: 5 months. You will receive a reminder letter in the mail two months in advance. If you don't receive a letter, please call our office to schedule the follow-up appointment.  Any Other Special Instructions Will Be Listed Below (If Applicable).     If you need a refill on your cardiac medications before your next appointment, please call your pharmacy.

## 2017-04-21 ENCOUNTER — Telehealth: Payer: Self-pay | Admitting: Cardiology

## 2017-04-21 NOTE — Telephone Encounter (Signed)
Patient would like to have her echo at [facility] and not [facility], Please check on that for her.. And I will send a note to [employer] for [employer]#.Marland Kitchen

## 2017-04-21 NOTE — Telephone Encounter (Signed)
The order has been changed to external.

## 2017-05-03 DIAGNOSIS — I255 Ischemic cardiomyopathy: Secondary | ICD-10-CM | POA: Diagnosis not present

## 2017-05-04 ENCOUNTER — Ambulatory Visit (HOSPITAL_BASED_OUTPATIENT_CLINIC_OR_DEPARTMENT_OTHER): Payer: Medicare Other

## 2017-05-05 ENCOUNTER — Telehealth: Payer: Self-pay | Admitting: Cardiology

## 2017-05-05 NOTE — Telephone Encounter (Signed)
Pacemaker battery has not gone off and Entresto is $500

## 2017-05-05 NOTE — Telephone Encounter (Signed)
Left message to return call 

## 2017-05-05 NOTE — Telephone Encounter (Signed)
Patient said during her last visit she was informed that her pacemaker battery life was only about 3 months. That was in November and she wanted to make [place] aware that she has not received notification of low battery. Patient assured that it is possible the battery could have a little more time in it than predicted. Assured patient she should be getting a letter in the mail to schedule appointment with Dr. Curt Bears soon. Patient explained that she did receive samples when started on entresto in February and she was given a 30 day supply card as well. Advised patient to go ahead and use 30 day supply card and I would mail her patient assistance paperwork to fill out and mail back to [place] for additional financial support. Patient agreeable and verbalized understanding. No further questions.

## 2017-06-17 ENCOUNTER — Telehealth: Payer: Self-pay | Admitting: *Deleted

## 2017-06-17 DIAGNOSIS — I513 Intracardiac thrombosis, not elsewhere classified: Secondary | ICD-10-CM

## 2017-06-17 DIAGNOSIS — I429 Cardiomyopathy, unspecified: Secondary | ICD-10-CM

## 2017-06-17 NOTE — Telephone Encounter (Signed)
Patient informed of echocardiogram results. Patient agreeable to proceed with cardiac CT. Left message for patient to return call with further instructions.

## 2017-06-17 NOTE — Telephone Encounter (Signed)
-----   Message from Park Liter, MD sent at 06/11/2017  2:49 PM EDT ----- Echo - diminished EF, like before, there is possibility of mural thrombus, Options: 1/ CT of the heart, 2/ Start anticoagulation

## 2017-06-18 NOTE — Telephone Encounter (Signed)
Advised patient that the cardiac CT has been ordered. Informed her that testing will happen at [facility] and that someone will call her to schedule. Advised patient that Dr. Agustin Cree wants to see the results of this test before making changes to anticoagulation. Patient verbalized understanding. No further questions.

## 2017-07-16 DIAGNOSIS — Z1331 Encounter for screening for depression: Secondary | ICD-10-CM | POA: Diagnosis not present

## 2017-07-16 DIAGNOSIS — E785 Hyperlipidemia, unspecified: Secondary | ICD-10-CM | POA: Diagnosis not present

## 2017-07-16 DIAGNOSIS — E1169 Type 2 diabetes mellitus with other specified complication: Secondary | ICD-10-CM | POA: Diagnosis not present

## 2017-07-16 DIAGNOSIS — E611 Iron deficiency: Secondary | ICD-10-CM | POA: Diagnosis not present

## 2017-07-16 DIAGNOSIS — Z1231 Encounter for screening mammogram for malignant neoplasm of breast: Secondary | ICD-10-CM | POA: Diagnosis not present

## 2017-07-16 DIAGNOSIS — R51 Headache: Secondary | ICD-10-CM | POA: Diagnosis not present

## 2017-07-16 DIAGNOSIS — Z0001 Encounter for general adult medical examination with abnormal findings: Secondary | ICD-10-CM | POA: Diagnosis not present

## 2017-07-16 DIAGNOSIS — Z79899 Other long term (current) drug therapy: Secondary | ICD-10-CM | POA: Diagnosis not present

## 2017-07-16 DIAGNOSIS — I10 Essential (primary) hypertension: Secondary | ICD-10-CM | POA: Diagnosis not present

## 2017-07-16 DIAGNOSIS — Z1389 Encounter for screening for other disorder: Secondary | ICD-10-CM | POA: Diagnosis not present

## 2017-07-16 DIAGNOSIS — E039 Hypothyroidism, unspecified: Secondary | ICD-10-CM | POA: Diagnosis not present

## 2017-07-19 ENCOUNTER — Telehealth: Payer: Self-pay | Admitting: Cardiology

## 2017-07-19 NOTE — Telephone Encounter (Signed)
Left message to return call 

## 2017-07-19 NOTE — Telephone Encounter (Signed)
Patient informed that the scheduler for the cardiac CT has been notified to make sure this test will be scheduled soon for her. Patient advised that this testing will be done at [facility]. Patient verbalized understanding. No further questions.

## 2017-07-19 NOTE — Telephone Encounter (Signed)
States we were supposed to be looking into her getting a cat scan of her heart and is wanting to check the status on that

## 2017-08-04 DIAGNOSIS — E119 Type 2 diabetes mellitus without complications: Secondary | ICD-10-CM | POA: Diagnosis not present

## 2017-08-04 DIAGNOSIS — H2513 Age-related nuclear cataract, bilateral: Secondary | ICD-10-CM | POA: Diagnosis not present

## 2017-08-24 DIAGNOSIS — R7989 Other specified abnormal findings of blood chemistry: Secondary | ICD-10-CM | POA: Diagnosis not present

## 2017-08-31 ENCOUNTER — Telehealth: Payer: Self-pay | Admitting: Cardiology

## 2017-08-31 NOTE — Telephone Encounter (Signed)
Patient returned my call and I advised her to contact [place] with out device clinic at [facility] for further recommendations. Christina Perry's number was given to patient. No further questions.

## 2017-08-31 NOTE — Telephone Encounter (Signed)
This message has been routed to the [address] device clinic. Left message informing patient that she should be getting a call from them. Advised her to call our office back with any other questions or concerns.

## 2017-08-31 NOTE — Telephone Encounter (Signed)
LMTCB//[id number] 

## 2017-08-31 NOTE — Telephone Encounter (Signed)
Patient has a defib/pacer and the battery alarm went off on her this am.. Please call patient.

## 2017-09-01 NOTE — Telephone Encounter (Signed)
Spoke with patient who stated her device started beeping Monday informed pt that she could come into the device clinic to have alert turned off but she would have to come to [place] office and she would also need a separate appointment with a provider to discuss generator change out. Pt stated that she would like to only have to come to one appointment unless she could go to [facility] informed her that as present time we are not staffing that office. Pt agreeable to apt on 09/09/17@ 9:30am with Tommye Standard to discuss generator change address and directions given to [facility] office as well as phone number.

## 2017-09-06 ENCOUNTER — Telehealth: Payer: Self-pay | Admitting: Emergency Medicine

## 2017-09-06 ENCOUNTER — Encounter: Payer: Self-pay | Admitting: Emergency Medicine

## 2017-09-06 NOTE — Telephone Encounter (Signed)
Left message for patient to call office back for further treatment plan.

## 2017-09-08 DIAGNOSIS — R7989 Other specified abnormal findings of blood chemistry: Secondary | ICD-10-CM | POA: Diagnosis not present

## 2017-09-08 NOTE — Progress Notes (Signed)
Cardiology Office Note Date:  09/09/2017  Patient ID:  Christina Perry, DOB 05-Aug-1950, MRN [id number] PCP:  Ernestene Kiel, MD  Cardiologist:  Dr. Agustin Cree Electrophysiologist: Dr. Curt Bears   Chief Complaint: device has reached ERI  History of Present Illness: Christina Perry is a 67 y.o. female with history of CAD, HTN, HLD, DM, hypothyroidism, ICM w/ICD.   She comes in today to be seen for Dr. Curt Bears.  She last saw him in Nov 2018, at that time doing well, her device was nearing ERI.  The patient earlier this month noted the tone that was demonstrated for her to indicate battery was low and called for an appointment.  The patient is accompanied by her husband.  She is feeling well.  No CP, palpitations or SOB, no dizziness, near syncope or syncope.  She denies any symptoms of orthopnea or PND.  She mentions the CT to f/u on her echo has been delayed 2/2 insurance issues, though has it scheduled for next week and sees Dr. Agustin Cree as well.  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  Device information: MDT  Dual chamber ICD, implanted 01/27/10   Past Medical History:  Diagnosis Date  . Anxiety   . Cardiomyopathy ([place])   . Coronary artery stenosis   . Depression   . Diabetes mellitus without complication ([place])   . Hyperlipidemia   . Hypertension   . MI (myocardial infarction) ([place])   . Thyroid disease     Past Surgical History:  Procedure Laterality Date  . CAROTID STENT    . CESAREAN SECTION    . CORONARY ANGIOPLASTY    . ICD Placement      Current Outpatient Medications  Medication Sig Dispense Refill  . aspirin (GOODSENSE ASPIRIN) 325 MG tablet Take 325 mg by mouth daily.    Marland Kitchen atorvastatin (LIPITOR) 20 MG tablet Take 20 mg by mouth daily.    . carvedilol (COREG) 12.5 MG tablet Take 12.5 mg by mouth 2 (two) times daily.     . furosemide (LASIX) 40 MG tablet Take 20 mg by mouth as needed. 20mg  Mon and Thursday     . glimepiride (AMARYL) 4 MG  tablet Take 4 mg by mouth daily.    Marland Kitchen levothyroxine (SYNTHROID, LEVOTHROID) 150 MCG tablet Take 150 mcg by mouth daily before breakfast.    . Magnesium 500 MG TABS Take 1,000 mg by mouth 2 (two) times daily.     . Melatonin 3 MG TABS Take 1 tablet by mouth as needed for sleep.    . metFORMIN (GLUCOPHAGE) 1000 MG tablet Take 1,000 mg by mouth 2 (two) times daily.    . Multiple Vitamin (MULTI-VITAMINS) TABS Take 1 tablet by mouth daily.    Marland Kitchen PARoxetine (PAXIL) 20 MG tablet Take 20 mg by mouth daily.    . sacubitril-valsartan (ENTRESTO) 49-51 MG Take 1 tablet by mouth 2 (two) times daily. 60 tablet 3  . telmisartan (MICARDIS) 40 MG tablet Take 40 mg by mouth daily.  6  . vitamin E 400 UNIT capsule Take 1 capsule by mouth daily.    Marland Kitchen levothyroxine (SYNTHROID, LEVOTHROID) 137 MCG tablet Take 137 mcg by mouth daily.    . montelukast (SINGULAIR) 10 MG tablet Take 10 mg by mouth at bedtime.      No current facility-administered medications for this visit.     Allergies:   Patient has no known allergies.   Social History:  The patient  reports that she has  never smoked. She has never used smokeless tobacco. She reports that she drinks alcohol. She reports that she does not use drugs.   Family History:  The patient's family history includes CAD in her mother; COPD in her mother; Diabetes in her mother; Stroke in her mother.  ROS:  Please see the history of present illness.   All other systems are reviewed and otherwise negative.   PHYSICAL EXAM:  VS:  BP 116/64   Pulse 60   Ht 5\' 3"  (1.6 m)   Wt 204 lb 6.4 oz (92.7 kg)   SpO2 96%   BMI 36.21 kg/m  BMI: Body mass index is 36.21 kg/m. Well nourished, well developed, in no acute distress  HEENT: normocephalic, atraumatic  Neck: no JVD, carotid bruits or masses Cardiac:  RRR; no significant murmurs, no rubs, or gallops Lungs:  CTA b/l, no wheezing, rhonchi or rales  Abd: soft, nontender MS: no deformity or atrophy Ext: no edema  Skin:  warm and dry, no rash Neuro:  No gross deficits appreciated Psych: euthymic mood, full affect  ICD site is stable, no tethering or discomfort   EKG:  Done today and reviewed by myself is A Paced, LAD, 60bpm ICD interrogation done today and reviewed by myself; battery reached ERI 08/30/17.no AF/VT episodes. A lead measurements are OK, , her V lead is programmed at 5V/1.77ms, no historical threshold testing is available, her threshold today is 3V/1.28ms, R wave amplitude and lead impedance are stable and OK, She AP 44.8%, VP is <0.1% No programming changes made  05/03/17: TTE LVEF 40-45%, mid-distal akinesis, an hypokinesis Impaired relaxation Possible apical thrombus could not be r/o  Recent Labs: 01/06/2017: BUN 19; Creatinine, Ser 1.22; Potassium 5.4; Sodium 142  No results found for requested labs within last 8760 hours.   CrCl cannot be calculated (Patient's most recent lab result is older than the maximum 21 days allowed.).   Wt Readings from Last 3 Encounters:  09/09/17 204 lb 6.4 oz (92.7 kg)  04/13/17 202 lb (91.6 kg)  01/06/17 204 lb 6.4 oz (92.7 kg)     Other studies reviewed: Additional studies/records reviewed today include: summarized above  ASSESSMENT AND PLAN:  1. ICD     Reached ERI 08/30/17     Generator change procedure, potential risks and benefits were discussed, she is agreeable to proceed     RV lead presumably has had chronically high thresholds given programming, though new to our practice.  The patient reports that her RV lead has always tested this way.      I do not suspect plans for RV lead revision though discussed the possibility with the patient, including how it would change the procedure, risks/benefots of new lead implant, and an overnight stay.  She is still agreeable to proceed.       I will send to Dr. Curt Bears  The patient has her CT scan next week, and sees Dr. Agustin Cree, low suspicion of thrombus, though given we have plenty of time for her  gen change, will wait until she finishes this w/u in-case we need to work around anticoagulation.  I left her 325mg  ASA, this looks like it was discussed with her echo report (?) and will defer this to Dr. Agustin Cree as well.  Her LVEF  40-45% now, no historical studies available.   2. ICM     No symptoms or exam findings to suggest fluid OL     Weight appears to wax/wane 2lbs, OptiVol looks good  On coreg/entresto  3. CAD      No anginal complaints      On ASA, BB, statin      C/w her primary cardiologist  4. HTN     Looks good, no changes    Disposition: The patient will call to schedule her gen change procedure and follow up.   In further thinking about the patient after she left, given her EF is 40-45%, I will review further with Dr. Curt Bears.  She carries hx of ICM and presumably her implant indication was primary prevention without noted arrest history.  Her lifetime device counter has one VF/one shock, this is likely implant DFT testing.    Current medicines are reviewed at length with the patient today.  The patient did not have any concerns regarding medicines.  Venetia Night, PA-C 09/09/2017 10:00 AM      [place] [address] [phone] (office)  [phone] (fax)

## 2017-09-09 ENCOUNTER — Ambulatory Visit (INDEPENDENT_AMBULATORY_CARE_PROVIDER_SITE_OTHER): Payer: Medicare Other | Admitting: Physician Assistant

## 2017-09-09 ENCOUNTER — Encounter: Payer: Self-pay | Admitting: Physician Assistant

## 2017-09-09 VITALS — BP 116/64 | HR 60 | Ht 63.0 in | Wt 204.4 lb

## 2017-09-09 DIAGNOSIS — I2510 Atherosclerotic heart disease of native coronary artery without angina pectoris: Secondary | ICD-10-CM

## 2017-09-09 DIAGNOSIS — I255 Ischemic cardiomyopathy: Secondary | ICD-10-CM | POA: Diagnosis not present

## 2017-09-09 DIAGNOSIS — Z4502 Encounter for adjustment and management of automatic implantable cardiac defibrillator: Secondary | ICD-10-CM

## 2017-09-09 DIAGNOSIS — I10 Essential (primary) hypertension: Secondary | ICD-10-CM | POA: Diagnosis not present

## 2017-09-09 DIAGNOSIS — Z4509 Encounter for adjustment and management of other cardiac device: Secondary | ICD-10-CM | POA: Diagnosis not present

## 2017-09-09 NOTE — Patient Instructions (Addendum)
Medication Instructions:  Your physician recommends that you continue on your current medications as directed. Please refer to the Current Medication list given to you today.  * If you need a refill on your cardiac medications before your next appointment, please call your pharmacy.   Labwork: None ordered  Testing/Procedures: Your defibrillator battery needs to be changed.  Please call Solaris Kram, RN once you have seen Dr. Agustin Cree next week.  [phone]  Follow-Up: To be determined  *Please note that any paperwork needing to be filled out by the provider will need to be addressed at the front desk prior to seeing the provider. Please note that any FMLA, disability or other documents regarding health condition is subject to a $25.00 charge that must be received prior to completion of paperwork in the form of a money order or check.  Thank you for choosing [facility]!!   Trinidad Curet, RN [phone]  Any Other Special Instructions Will Be Listed Below (If Applicable).

## 2017-09-13 ENCOUNTER — Ambulatory Visit (INDEPENDENT_AMBULATORY_CARE_PROVIDER_SITE_OTHER): Payer: Medicare Other | Admitting: Cardiology

## 2017-09-13 ENCOUNTER — Encounter: Payer: Self-pay | Admitting: Cardiology

## 2017-09-13 VITALS — BP 122/66 | HR 60 | Ht 63.0 in | Wt 204.0 lb

## 2017-09-13 DIAGNOSIS — E785 Hyperlipidemia, unspecified: Secondary | ICD-10-CM | POA: Diagnosis not present

## 2017-09-13 DIAGNOSIS — Z794 Long term (current) use of insulin: Secondary | ICD-10-CM

## 2017-09-13 DIAGNOSIS — E119 Type 2 diabetes mellitus without complications: Secondary | ICD-10-CM | POA: Diagnosis not present

## 2017-09-13 DIAGNOSIS — Z95810 Presence of automatic (implantable) cardiac defibrillator: Secondary | ICD-10-CM

## 2017-09-13 DIAGNOSIS — I2510 Atherosclerotic heart disease of native coronary artery without angina pectoris: Secondary | ICD-10-CM

## 2017-09-13 DIAGNOSIS — I255 Ischemic cardiomyopathy: Secondary | ICD-10-CM | POA: Diagnosis not present

## 2017-09-13 NOTE — Patient Instructions (Signed)
Medication Instructions:  Your physician recommends that you continue on your current medications as directed. Please refer to the Current Medication list given to you today.  Please call and follow up regarding entresto  Labwork: None  Testing/Procedures: None  Follow-Up: Your physician recommends that you schedule a follow-up appointment in: 5 months  Any Other Special Instructions Will Be Listed Below (If Applicable).     If you need a refill on your cardiac medications before your next appointment, please call your pharmacy.   [place], RN, BSN

## 2017-09-13 NOTE — Progress Notes (Signed)
Cardiology Office Note:    Date:  09/13/2017   ID:  Christina Perry, DOB [birth year], MRN [id number]  PCP:  Ernestene Kiel, MD  Cardiologist:  Jenne Campus, MD    Referring MD: Ernestene Kiel, MD   Chief Complaint  Patient presents with  . Follow-up  I am doing well  History of Present Illness:    Christina Perry is a 67 y.o. 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  She comes to my office to follow-up seems to be doing well denies have any chest pain tightness squeezing pressure burning chest.  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  She can walk climb stairs with some shortness of breath and fatigue but no chest pain tightness squeezing pressure burning chest.  She had echocardiogram done which showed ejection fraction 45% also area of hypokinesis as noted previously and statement is I cannot rule out small LV thrombus.  She is scheduled to have CT of her chest that will be done within a week to clarify this diagnosis.  I do not think we have enough level of suspicion to initiate anticoagulation she is on full dose of aspirin which I will continue for now.  Denies having any chest pain tightness squeezing pressure burning chest on top of that her device is reaching ERI she already seen the EP team with consideration of replacement.  We had discussion about this today.  Her ejection fraction is more than 45% but she wants to proceed with defibrillator replacement.  Past Medical History:  Diagnosis Date  . Anxiety   . Cardiomyopathy ([place])   . Coronary artery stenosis   . Depression   . Diabetes mellitus without complication ([place])   . Hyperlipidemia   . Hypertension   . MI (myocardial infarction) ([place])   . Thyroid disease     Past Surgical History:  Procedure Laterality Date  . CAROTID STENT    . CESAREAN SECTION    . CORONARY ANGIOPLASTY    . ICD Placement      Current Medications: Current  Meds  Medication Sig  . aspirin (GOODSENSE ASPIRIN) 325 MG tablet Take 325 mg by mouth daily.  Marland Kitchen atorvastatin (LIPITOR) 20 MG tablet Take 20 mg by mouth daily.  . carvedilol (COREG) 12.5 MG tablet Take 12.5 mg by mouth 2 (two) times daily.   . furosemide (LASIX) 40 MG tablet Take 20 mg by mouth as needed. 20mg  Mon and Thursday   . glimepiride (AMARYL) 4 MG tablet Take 4 mg by mouth daily.  . IRON PO Take 65 mg by mouth daily.  Marland Kitchen levothyroxine (SYNTHROID, LEVOTHROID) 150 MCG tablet Take 150 mcg by mouth daily before breakfast.  . Magnesium 500 MG TABS Take 1,000 mg by mouth 2 (two) times daily.   . Melatonin 3 MG TABS Take 1 tablet by mouth as needed for sleep.  . metFORMIN (GLUCOPHAGE) 1000 MG tablet Take 1,000 mg by mouth 2 (two) times daily.  . Multiple Vitamin (MULTI-VITAMINS) TABS Take 1 tablet by mouth daily.  Marland Kitchen PARoxetine (PAXIL) 10 MG tablet Take 10 mg by mouth daily.  Marland Kitchen telmisartan (MICARDIS) 40 MG tablet Take 40 mg by mouth daily.     Allergies:   Patient has no known allergies.   Social History   Socioeconomic History  . Marital status: Married    Spouse name: Not on file  . Number of children: Not on file  . Years of education: Not  on file  . Highest education level: Not on file  Occupational History  . Not on file  Social Needs  . Financial resource strain: Not on file  . Food insecurity:    Worry: Not on file    Inability: Not on file  . Transportation needs:    Medical: Not on file    Non-medical: Not on file  Tobacco Use  . Smoking status: Never Smoker  . Smokeless tobacco: Never Used  Substance and Sexual Activity  . Alcohol use: Yes  . Drug use: No  . Sexual activity: Not on file  Lifestyle  . Physical activity:    Days per week: Not on file    Minutes per session: Not on file  . Stress: Not on file  Relationships  . Social connections:    Talks on phone: Not on file    Gets together: Not on file    Attends religious service: Not on file     Active member of club or organization: Not on file    Attends meetings of clubs or organizations: Not on file    Relationship status: Not on file  Other Topics Concern  . Not on file  Social History Narrative  . Not on file     Family History: The patient's family history includes CAD in her mother; COPD in her mother; Diabetes in her mother; Stroke in her mother. ROS:   Please see the history of present illness.    All 14 point review of systems negative except as described per history of present illness  EKGs/Labs/Other Studies Reviewed:      Recent Labs: 01/06/2017: BUN 19; Creatinine, Ser 1.22; Potassium 5.4; Sodium 142  Recent Lipid Panel No results found for: CHOL, TRIG, HDL, CHOLHDL, VLDL, LDLCALC, LDLDIRECT  Physical Exam:    VS:  BP 122/66 (BP Location: Right Arm, Patient Position: Sitting, Cuff Size: Large)   Pulse 60   Ht 5\' 3"  (1.6 m)   Wt 204 lb (92.5 kg)   SpO2 97%   BMI 36.14 kg/m     Wt Readings from Last 3 Encounters:  09/13/17 204 lb (92.5 kg)  09/09/17 204 lb 6.4 oz (92.7 kg)  04/13/17 202 lb (91.6 kg)     GEN:  Well nourished, well developed in no acute distress HEENT: Normal NECK: No JVD; No carotid bruits LYMPHATICS: No lymphadenopathy CARDIAC: RRR, no murmurs, no rubs, no gallops RESPIRATORY:  Clear to auscultation without rales, wheezing or rhonchi  ABDOMEN: Soft, non-tender, non-distended MUSCULOSKELETAL:  No edema; No deformity  SKIN: Warm and dry LOWER EXTREMITIES: no swelling NEUROLOGIC:  Alert and oriented x 3 PSYCHIATRIC:  Normal affect   ASSESSMENT:    1. Ischemic cardiomyopathy   2. Coronary artery disease involving native coronary artery of native heart without angina pectoris   3. Type 2 diabetes mellitus without complication, with long-term current use of insulin ([place])   4. Dyslipidemia   5. ICD (implantable cardioverter-defibrillator) in place    PLAN:    In order of problems listed above:  1. Ischemic cardia  myopathy she is on beta-blocker as well as ARB.  We will try to put her on Entresto for some reason there is some issue with insurance company.  I will try to figure out what the problem since if we can help her with this. 2. Coronary artery disease stable with no recent events. 3. Type 2 diabetes her last hemoglobin A1c 6.8 is much better than it used to be.  4. Dyslipidemia.  She is taking Lipitor 20 mg I did review her cholesterol from primary care physician and it is excellent. 5. ICD present: Again plan to replacement.  Overall she is doing well and I see her back in my office in about 5 months.   Medication Adjustments/Labs and Tests Ordered: Current medicines are reviewed at length with the patient today.  Concerns regarding medicines are outlined above.  No orders of the defined types were placed in this encounter.  Medication changes: No orders of the defined types were placed in this encounter.   Signed, Park Liter, MD, [facility] 09/13/2017 9:48 AM    [place]

## 2017-09-16 DIAGNOSIS — E875 Hyperkalemia: Secondary | ICD-10-CM | POA: Diagnosis not present

## 2017-09-21 ENCOUNTER — Ambulatory Visit (HOSPITAL_COMMUNITY)
Admission: RE | Admit: 2017-09-21 | Discharge: 2017-09-21 | Disposition: A | Discharge status: Home / Self Care | Payer: Medicare Other | Source: Ambulatory Visit | Attending: Cardiology | Admitting: Cardiology

## 2017-09-21 ENCOUNTER — Ambulatory Visit (HOSPITAL_COMMUNITY): Admission: RE | Admit: 2017-09-21 | Payer: Medicare Other | Source: Ambulatory Visit

## 2017-09-21 ENCOUNTER — Encounter (HOSPITAL_COMMUNITY): Payer: Self-pay

## 2017-09-21 DIAGNOSIS — I429 Cardiomyopathy, unspecified: Secondary | ICD-10-CM | POA: Insufficient documentation

## 2017-09-21 DIAGNOSIS — I255 Ischemic cardiomyopathy: Secondary | ICD-10-CM | POA: Diagnosis not present

## 2017-09-21 LAB — POCT I-STAT CREATININE: Creatinine, Ser: 1.1 mg/dL — ABNORMAL HIGH (ref 0.44–1.00)

## 2017-09-21 IMAGING — CT CT HEART MORP W/ CTA COR W/ SCORE W/ CA W/CM &/OR W/O CM
2 of 10 series · 8 of 20 positions shown, 9 images · IV contrast (APPLIED)
Comparison: None.

CLINICAL DATA: Ischemic Cardiomyopathy R/O Apical thrombus

EXAM:
Cardiac CTA
MEDICATIONS:
Sub lingual nitro. 4mg and lopressor None
TECHNIQUE: The patient was scanned on a Siemens Force slice scanner. Gantry
rotation speed was 270 msecs. Collimation was .9mm. A gated study
was done to focus on LV function rather than coronary arteries since
there was a question of apical thrombus. Reconstructions were done
in 10% intervals to cine play back

[Series 8: 0-90% · axial · 0.45mm/px · z∈[+1053,+1213]mm · 4 of 4430 slices shown, 5 images]
[im 886/4430  vessel]
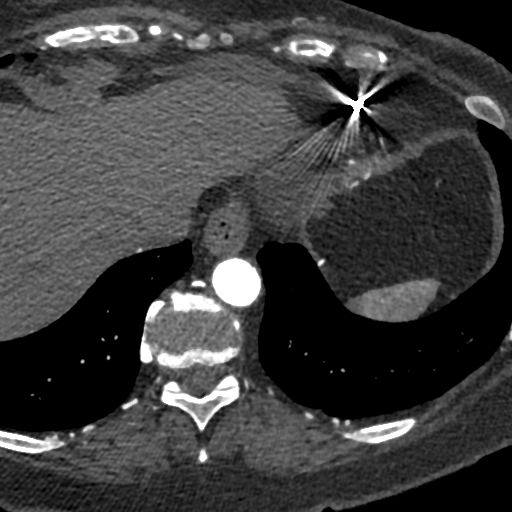
[im 886/4430  lung]
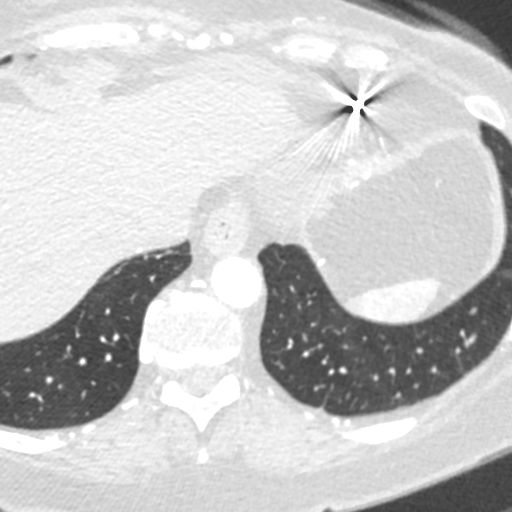
[im 1772/4430  vessel]
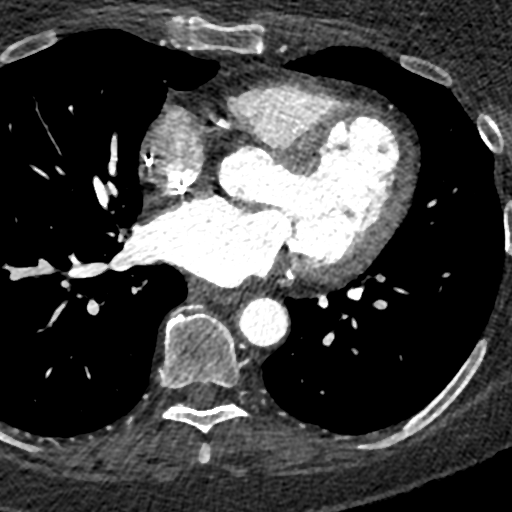
[im 2658/4430  vessel]
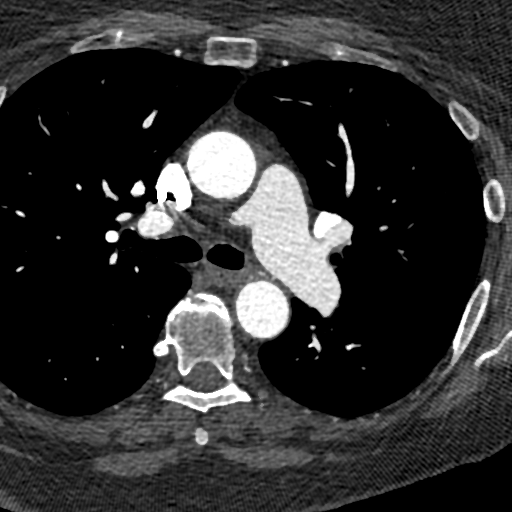
[im 3544/4430  vessel]
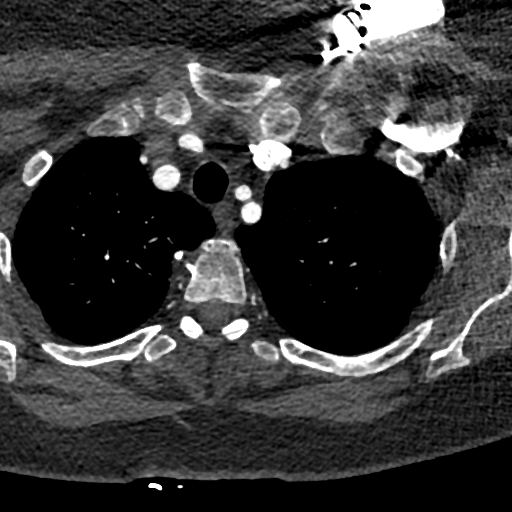

[Series 9: 5-95% · axial · 0.45mm/px · z∈[+1053,+1213]mm · 4 of 4430 slices shown]
[im 886/4430  vessel]
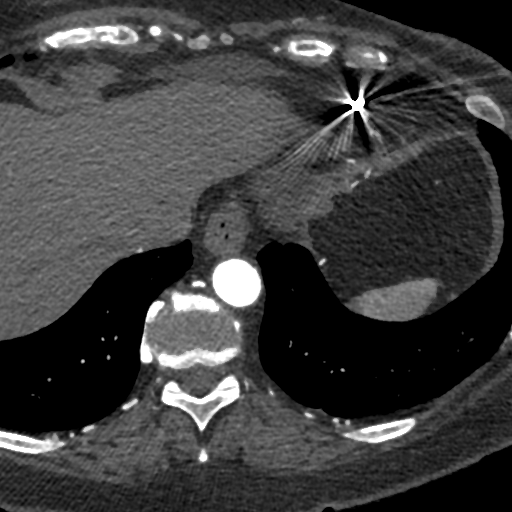
[im 1772/4430  vessel]
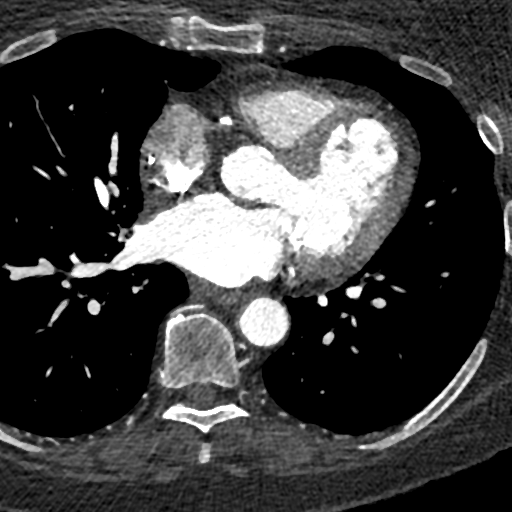
[im 2658/4430  vessel]
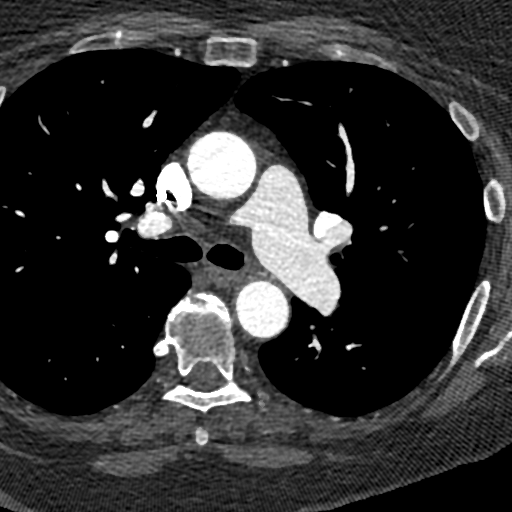
[im 3544/4430  vessel]
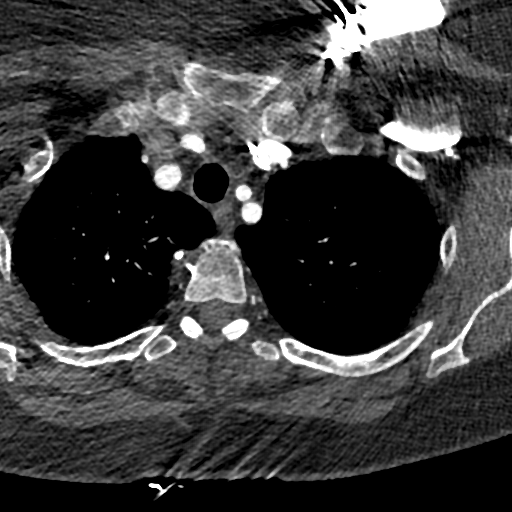

[8 of 20 positions shown; findings below may reference images not displayed]

:
Non-cardiac: See separate report from [REDACTED]. No
significant findings on limited lung and soft tissue windows.

Coronary Arteries: Sub-optimal study with mis registration artifact
and paced rhythm Artifact from AICD. There is a patent stent in the
proximal LAD However there appears to be a significant >70% stenosis
in the mid LAD distal to the stent with poor filling of the distal
vessel. RCA is patent with 50% or less diffuse calcific disease in
proximal mid and distal segments. Circumflex probable 50-75% mixed
plaque in the proximal vessel

Functional imaging: There was moderate LVE. The distal anterior wall
septum and apex were akinetic There was a small mural apical
thrombus present
IMPRESSION: 1. Small mural apical thrombus present in setting of previous
anterior wall MI with septal, apical and distal anterior wall
akinesis

2. Patent stent in proximal LAD with possible hemodynamically
significant residual disease in mid LAD and proximal circumflex

Misregistration artifact and artifact from AICD wires makes study
unsuitable for FFR CT analysis

GLASS

EXAM:
OVER-READ INTERPRETATION  CT CHEST

The following report is an over-read performed by radiologist Dr.
GLASS [REDACTED] on [DATE]. This
over-read does not include interpretation of cardiac or coronary
anatomy or pathology. The coronary CTA interpretation by the
cardiologist is attached.
FINDINGS: Limited view of the lung parenchyma demonstrates no suspicious
nodularity. Airways are normal.

Limited view of the mediastinum demonstrates no adenopathy.
Esophagus normal.

Limited view of the upper abdomen unremarkable.

Limited view of the skeleton and chest wall is unremarkable.
IMPRESSION: No significant extracardiac findings.

## 2017-09-21 MED ORDER — IOPAMIDOL (ISOVUE-370) INJECTION 76%
100.0000 mL | Freq: Once | INTRAVENOUS | Status: AC | PRN
  Administered 2017-09-21: 100 mL via INTRAVENOUS

## 2017-09-21 MED ORDER — NITROGLYCERIN 0.4 MG SL SUBL
0.8000 mg | SUBLINGUAL_TABLET | Freq: Once | SUBLINGUAL | Status: AC
  Administered 2017-09-21: 0.8 mg via SUBLINGUAL
  Filled 2017-09-21: qty 25

## 2017-09-21 MED ORDER — IOPAMIDOL (ISOVUE-370) INJECTION 76%
INTRAVENOUS | Status: AC
  Filled 2017-09-21: qty 100

## 2017-09-21 MED ORDER — NITROGLYCERIN 0.4 MG SL SUBL
SUBLINGUAL_TABLET | SUBLINGUAL | Status: AC
  Filled 2017-09-21: qty 2

## 2017-09-21 NOTE — Progress Notes (Signed)
CT scan completed. Tolerated well. D/c home walking with husband. Awake and alert. In no distress.

## 2017-09-22 ENCOUNTER — Other Ambulatory Visit: Payer: Self-pay

## 2017-09-22 DIAGNOSIS — I2510 Atherosclerotic heart disease of native coronary artery without angina pectoris: Secondary | ICD-10-CM

## 2017-09-22 DIAGNOSIS — R931 Abnormal findings on diagnostic imaging of heart and coronary circulation: Secondary | ICD-10-CM

## 2017-09-22 DIAGNOSIS — I255 Ischemic cardiomyopathy: Secondary | ICD-10-CM

## 2017-09-22 MED ORDER — APIXABAN 5 MG PO TABS: 5.0000 mg | ORAL_TABLET | Freq: Two times a day (BID) | ORAL | 2 refills | Status: DC

## 2017-09-23 DIAGNOSIS — I255 Ischemic cardiomyopathy: Secondary | ICD-10-CM | POA: Diagnosis not present

## 2017-09-23 DIAGNOSIS — R931 Abnormal findings on diagnostic imaging of heart and coronary circulation: Secondary | ICD-10-CM | POA: Diagnosis not present

## 2017-09-23 DIAGNOSIS — I2510 Atherosclerotic heart disease of native coronary artery without angina pectoris: Secondary | ICD-10-CM | POA: Diagnosis not present

## 2017-09-23 LAB — CBC WITH DIFFERENTIAL/PLATELET
BASOS: 1 %
Basophils Absolute: 0.1 10*3/uL (ref 0.0–0.2)
EOS (ABSOLUTE): 0.2 10*3/uL (ref 0.0–0.4)
Eos: 3 %
Hematocrit: 38.9 % (ref 34.0–46.6)
Hemoglobin: 12.4 g/dL (ref 11.1–15.9)
Immature Grans (Abs): 0 10*3/uL (ref 0.0–0.1)
Immature Granulocytes: 0 %
LYMPHS: 23 %
Lymphocytes Absolute: 1.8 10*3/uL (ref 0.7–3.1)
MCH: 26.5 pg — AB (ref 26.6–33.0)
MCHC: 31.9 g/dL (ref 31.5–35.7)
MCV: 83 fL (ref 79–97)
MONOS ABS: 0.9 10*3/uL (ref 0.1–0.9)
Monocytes: 12 %
NEUTROS ABS: 4.8 10*3/uL (ref 1.4–7.0)
NEUTROS PCT: 61 %
PLATELETS: 258 10*3/uL (ref 150–450)
RBC: 4.68 x10E6/uL (ref 3.77–5.28)
RDW: 20.1 % — ABNORMAL HIGH (ref 12.3–15.4)
WBC: 7.8 10*3/uL (ref 3.4–10.8)

## 2017-09-23 LAB — PROTIME-INR
INR: 1 (ref 0.8–1.2)
Prothrombin Time: 10.4 s (ref 9.1–12.0)

## 2017-09-24 DIAGNOSIS — R931 Abnormal findings on diagnostic imaging of heart and coronary circulation: Secondary | ICD-10-CM | POA: Diagnosis not present

## 2017-09-27 ENCOUNTER — Ambulatory Visit (INDEPENDENT_AMBULATORY_CARE_PROVIDER_SITE_OTHER): Payer: Medicare Other | Admitting: Cardiology

## 2017-09-27 VITALS — BP 122/70 | HR 68 | Ht 63.0 in | Wt 205.4 lb

## 2017-09-27 DIAGNOSIS — Z95810 Presence of automatic (implantable) cardiac defibrillator: Secondary | ICD-10-CM | POA: Diagnosis not present

## 2017-09-27 DIAGNOSIS — I255 Ischemic cardiomyopathy: Secondary | ICD-10-CM | POA: Diagnosis not present

## 2017-09-27 DIAGNOSIS — E785 Hyperlipidemia, unspecified: Secondary | ICD-10-CM

## 2017-09-27 DIAGNOSIS — I236 Thrombosis of atrium, auricular appendage, and ventricle as current complications following acute myocardial infarction: Secondary | ICD-10-CM | POA: Insufficient documentation

## 2017-09-27 DIAGNOSIS — I2510 Atherosclerotic heart disease of native coronary artery without angina pectoris: Secondary | ICD-10-CM

## 2017-09-27 HISTORY — DX: Thrombosis of atrium, auricular appendage, and ventricle as current complications following acute myocardial infarction: I23.6

## 2017-09-27 NOTE — Patient Instructions (Signed)
Medication Instructions:  Your physician recommends that you continue on your current medications as directed. Please refer to the Current Medication list given to you today.  Labwork: None ordered  Testing/Procedures: None ordered  Follow-Up: Your physician recommends that you schedule a follow-up appointment in: 3 months with Dr. Krasowski   Any Other Special Instructions Will Be Listed Below (If Applicable).     If you need a refill on your cardiac medications before your next appointment, please call your pharmacy.   

## 2017-09-27 NOTE — Progress Notes (Signed)
Cardiology Office Note:    Date:  09/27/2017   ID:  Christina Perry, DOB [birth year], MRN [id number]  PCP:  Ernestene Kiel, MD  Cardiologist:  Jenne Campus, MD    Referring MD: Ernestene Kiel, MD   Chief Complaint  Patient presents with  . Follow-up  I am here to discuss results of my tests  History of Present Illness:    Christina Perry is a 67 y.o. female with ischemic cardiomyopathy, ICD, ejection fraction 4045% with anterior wall akinesis and recent discovery thrombus in that location.  That was confirmed by CT of her heart.  Overall she is doing well denies have any chest pain tightness squeezing pressure burning chest.  Past Medical History:  Diagnosis Date  . Anxiety   . Cardiomyopathy ([place])   . Coronary artery stenosis   . Depression   . Diabetes mellitus without complication ([place])   . Hyperlipidemia   . Hypertension   . MI (myocardial infarction) ([place])   . Thyroid disease     Past Surgical History:  Procedure Laterality Date  . CAROTID STENT    . CESAREAN SECTION    . CORONARY ANGIOPLASTY    . ICD Placement      Current Medications: Current Meds  Medication Sig  . apixaban (ELIQUIS) 5 MG TABS tablet Take 1 tablet (5 mg total) by mouth 2 (two) times daily.  Marland Kitchen atorvastatin (LIPITOR) 20 MG tablet Take 20 mg by mouth daily.  . carvedilol (COREG) 12.5 MG tablet Take 12.5 mg by mouth 2 (two) times daily.   . furosemide (LASIX) 40 MG tablet Take 40 mg by mouth as needed. 40mg  Mon and Thursday  . glimepiride (AMARYL) 4 MG tablet Take 4 mg by mouth daily.  . IRON PO Take 65 mg by mouth daily.  Marland Kitchen levothyroxine (SYNTHROID, LEVOTHROID) 150 MCG tablet Take 150 mcg by mouth daily before breakfast.  . Magnesium 500 MG TABS Take 1,000 mg by mouth 2 (two) times daily.   . Melatonin 3 MG TABS Take 1 tablet by mouth as needed for sleep.  . metFORMIN (GLUCOPHAGE) 1000 MG tablet Take 1,000 mg by mouth 2 (two) times daily.  . Multiple Vitamin (MULTI-VITAMINS) TABS Take  1 tablet by mouth daily.  Marland Kitchen PARoxetine (PAXIL) 10 MG tablet Take 10 mg by mouth daily.     Allergies:   Patient has no known allergies.   Social History   Socioeconomic History  . Marital status: Married    Spouse name: Not on file  . Number of children: Not on file  . Years of education: Not on file  . Highest education level: Not on file  Occupational History  . Not on file  Social Needs  . Financial resource strain: Not on file  . Food insecurity:    Worry: Not on file    Inability: Not on file  . Transportation needs:    Medical: Not on file    Non-medical: Not on file  Tobacco Use  . Smoking status: Never Smoker  . Smokeless tobacco: Never Used  Substance and Sexual Activity  . Alcohol use: Yes  . Drug use: No  . Sexual activity: Not on file  Lifestyle  . Physical activity:    Days per week: Not on file    Minutes per session: Not on file  . Stress: Not on file  Relationships  . Social connections:    Talks on phone: Not on file    Gets together: Not on file  Attends religious service: Not on file    Active member of club or organization: Not on file    Attends meetings of clubs or organizations: Not on file    Relationship status: Not on file  Other Topics Concern  . Not on file  Social History Narrative  . Not on file     Family History: The patient's family history includes CAD in her mother; COPD in her mother; Diabetes in her mother; Stroke in her mother. ROS:   Please see the history of present illness.    All 14 point review of systems negative except as described per history of present illness  EKGs/Labs/Other Studies Reviewed:      Recent Labs: 01/06/2017: BUN 19; Potassium 5.4; Sodium 142 09/21/2017: Creatinine, Ser 1.10 09/23/2017: Hemoglobin 12.4; Platelets 258  Recent Lipid Panel No results found for: CHOL, TRIG, HDL, CHOLHDL, VLDL, LDLCALC, LDLDIRECT  Physical Exam:    VS:  BP 122/70   Pulse 68   Ht 5\' 3"  (1.6 m)   Wt 205 lb 6.4  oz (93.2 kg)   SpO2 97%   BMI 36.38 kg/m     Wt Readings from Last 3 Encounters:  09/27/17 205 lb 6.4 oz (93.2 kg)  09/13/17 204 lb (92.5 kg)  09/09/17 204 lb 6.4 oz (92.7 kg)     GEN:  Well nourished, well developed in no acute distress HEENT: Normal NECK: No JVD; No carotid bruits LYMPHATICS: No lymphadenopathy CARDIAC: RRR, no murmurs, no rubs, no gallops RESPIRATORY:  Clear to auscultation without rales, wheezing or rhonchi  ABDOMEN: Soft, non-tender, non-distended MUSCULOSKELETAL:  No edema; No deformity  SKIN: Warm and dry LOWER EXTREMITIES: no swelling NEUROLOGIC:  Alert and oriented x 3 PSYCHIATRIC:  Normal affect   ASSESSMENT:    1. Ischemic cardiomyopathy   2. LV (left ventricular) mural thrombus following MI ([place])   3. Coronary artery disease involving native coronary artery of native heart without angina pectoris   4. ICD (implantable cardioverter-defibrillator) in place   5. Dyslipidemia    PLAN:    In order of problems listed above:  1. Ischemic cardiomyopathy on beta-blocker, she was on ARB however developed hyperkalemia which forced [place] to discontinue this medication she is going to her primary care physician today to have blood test done to recheck and Chem-7 I hope I will be able to restart this medication may be with slightly higher dose of diuretics. 2. LV thrombus and location of old myocardial infarction as confirmed by CT of her heart we had a long discussion about what to do with the situation I recommended anticoagulation..  She agree with discussed contraindication complications of it.  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 3. Coronary artery disease denies have any symptoms no chest pain tightness squeezing pressure burning chest. 4. ICD present.  She will be scheduled to have it replaced.  Dyslipidemia, on atorvastatin and is scheduled to have fasting lipid profile done very soon. 5.    Medication Adjustments/Labs and Tests  Ordered: Current medicines are reviewed at length with the patient today.  Concerns regarding medicines are outlined above.  No orders of the defined types were placed in this encounter.  Medication changes: No orders of the defined types were placed in this encounter.   Signed, Park Liter, MD, [facility] 09/27/2017 11:15 AM    [place]

## 2017-09-28 LAB — FECAL OCCULT BLOOD, IMMUNOCHEMICAL: Fecal Occult Bld: NEGATIVE

## 2017-10-04 NOTE — Progress Notes (Signed)
Electrophysiology Office Note   Date:  10/05/2017   ID:  Christina Perry, DOB 14-Nov-1950, MRN [id number]  PCP:  Ernestene Kiel, MD  Cardiologist:  Agustin Cree Primary Electrophysiologist:  Constance Haw, MD    No chief complaint on file.    History of Present Illness: Christina Perry is a 67 y.o. female who is being seen today for the evaluation of ischemic cardiomyopathy at the request of Ernestene Kiel, MD. Presenting today for electrophysiology evaluation.  She has a history of an ischemic cardiomyopathy with a myocardial infarction at the age of 38, type 2 diabetes, hypertension, and hyperlipidemia.  She presents for evaluation of her ICD.  Today, denies symptoms of palpitations, chest pain, shortness of breath, orthopnea, PND, lower extremity edema, claudication, dizziness, presyncope, syncope, bleeding, or neurologic sequela. The patient is tolerating medications without difficulties.  Overall she is feeling well.  She is able to do all of her daily activities.  She has no major complaints today.  Device interrogation shows device at ERI.   Past Medical History:  Diagnosis Date  . Anxiety   . Cardiomyopathy ([place])   . Coronary artery stenosis   . Depression   . Diabetes mellitus without complication ([place])   . Hyperlipidemia   . Hypertension   . MI (myocardial infarction) ([place])   . Thyroid disease    Past Surgical History:  Procedure Laterality Date  . CAROTID STENT    . CESAREAN SECTION    . CORONARY ANGIOPLASTY    . ICD Placement       Current Outpatient Medications  Medication Sig Dispense Refill  . apixaban (ELIQUIS) 5 MG TABS tablet Take 1 tablet (5 mg total) by mouth 2 (two) times daily. 180 tablet 2  . atorvastatin (LIPITOR) 20 MG tablet Take 20 mg by mouth daily.    . carvedilol (COREG) 12.5 MG tablet Take 12.5 mg by mouth 2 (two) times daily.     . furosemide (LASIX) 40 MG tablet Take 40 mg by mouth as needed. 40mg  Mon and Thursday    .  glimepiride (AMARYL) 4 MG tablet Take 4 mg by mouth daily.    . IRON PO Take 65 mg by mouth daily.    Marland Kitchen levothyroxine (SYNTHROID, LEVOTHROID) 150 MCG tablet Take 150 mcg by mouth daily before breakfast.    . Magnesium 500 MG TABS Take 1,000 mg by mouth 2 (two) times daily.     . Melatonin 3 MG TABS Take 1 tablet by mouth as needed for sleep.    . metFORMIN (GLUCOPHAGE) 1000 MG tablet Take 1,000 mg by mouth 2 (two) times daily.    . Multiple Vitamin (MULTI-VITAMINS) TABS Take 1 tablet by mouth daily.    Marland Kitchen PARoxetine (PAXIL) 10 MG tablet Take 10 mg by mouth daily.     No current facility-administered medications for this visit.     Allergies:   Patient has no known allergies.   Social History:  The patient  reports that she has never smoked. She has never used smokeless tobacco. She reports that she drinks alcohol. She reports that she does not use drugs.   Family History:  The patient's family history includes CAD in her mother; COPD in her mother; Diabetes in her mother; Stroke in her mother.    ROS:  Please see the history of present illness.   Otherwise, review of systems is positive for none.   All other systems are reviewed and negative.   PHYSICAL EXAM: VS:  BP 138/76  Pulse 63   Ht 5\' 3"  (1.6 m)   Wt 205 lb 9.6 oz (93.3 kg)   SpO2 97%   BMI 36.42 kg/m  , BMI Body mass index is 36.42 kg/m. GEN: Well nourished, well developed, in no acute distress  HEENT: normal  Neck: no JVD, carotid bruits, or masses Cardiac: RRR; no murmurs, rubs, or gallops,no edema  Respiratory:  clear to auscultation bilaterally, normal work of breathing GI: soft, nontender, nondistended, + BS MS: no deformity or atrophy  Skin: warm and dry, device site well healed Neuro:  Strength and sensation are intact Psych: euthymic mood, full affect  EKG:  EKG is not ordered today. Personal review of the ekg ordered 09/09/17 shows A paced, rate 60  Personal review of the device interrogation today. Results  in [place]: 01/06/2017: BUN 19; Potassium 5.4; Sodium 142 09/21/2017: Creatinine, Ser 1.10 09/23/2017: Hemoglobin 12.4; Platelets 258    Lipid Panel  No results found for: CHOL, TRIG, HDL, CHOLHDL, VLDL, LDLCALC, LDLDIRECT   Wt Readings from Last 3 Encounters:  10/05/17 205 lb 9.6 oz (93.3 kg)  09/27/17 205 lb 6.4 oz (93.2 kg)  09/13/17 204 lb (92.5 kg)      Other studies Reviewed: Additional studies/ records that were reviewed today include: TTE 05/03/17 Left atrial enlargement Mild LV systolic dysfunction EF 16% with mid distal septal akinesis, anterior hypokinesis, apical akinesis Normal right heart structures Trace mitral regurgitation  ASSESSMENT AND PLAN:  1.  Systolic heart failure chronic due to ischemic cardiomyopathy: Currently on optimal medical therapy.  Has a Medtronic ICD in place with the battery close to EOS.  We Jesse Hirst plan for generator change.  Risks and benefits were discussed include bleeding and infection.  She understands the risks and is agreed to the procedure.  2.  Hypertension: Mildly elevated today.  Normal on other visits.  No changes.  3.  Hyperlipidemia: Continue statin  4.  Coronary artery disease: No current chest pain.  Per primary cardiology.  5.  LV thrombus: Found on echo in confirmed with CT scan.  Currently on Eliquis.  We Danielle Lento have her hold her Eliquis the day before the generator change.  Current medicines are reviewed at length with the patient today.   The patient does not have concerns regarding her medicines.  The following changes were made today:  none  Labs/ tests ordered today include:  Orders Placed This Encounter  Procedures  . Basic Metabolic Panel (BMET)     Disposition:   FU with Daiel Strohecker 3 months  Signed, Brack Shaddock Christina Leeds, MD  10/05/2017 2:12 PM     [place] [address] [place] [address] [phone] (office) [phone] (fax)

## 2017-10-05 ENCOUNTER — Ambulatory Visit (INDEPENDENT_AMBULATORY_CARE_PROVIDER_SITE_OTHER): Payer: Medicare Other | Admitting: Cardiology

## 2017-10-05 ENCOUNTER — Encounter: Payer: Self-pay | Admitting: Cardiology

## 2017-10-05 VITALS — BP 138/76 | HR 63 | Ht 63.0 in | Wt 205.6 lb

## 2017-10-05 DIAGNOSIS — I2510 Atherosclerotic heart disease of native coronary artery without angina pectoris: Secondary | ICD-10-CM

## 2017-10-05 DIAGNOSIS — I10 Essential (primary) hypertension: Secondary | ICD-10-CM

## 2017-10-05 DIAGNOSIS — Z01812 Encounter for preprocedural laboratory examination: Secondary | ICD-10-CM

## 2017-10-05 DIAGNOSIS — I255 Ischemic cardiomyopathy: Secondary | ICD-10-CM | POA: Diagnosis not present

## 2017-10-05 DIAGNOSIS — E785 Hyperlipidemia, unspecified: Secondary | ICD-10-CM

## 2017-10-05 DIAGNOSIS — I236 Thrombosis of atrium, auricular appendage, and ventricle as current complications following acute myocardial infarction: Secondary | ICD-10-CM | POA: Diagnosis not present

## 2017-10-05 NOTE — Patient Instructions (Addendum)
Medication Instructions:  Your physician recommends that you continue on your current medications as directed. Please refer to the Current Medication list given to you today.  * If you need a refill on your cardiac medications before your next appointment, please call your pharmacy.   Labwork: BMET today *We will only notify you of abnormal results, otherwise continue current treatment plan.  Testing/Procedures: Your physician has recommended that you have a defibrillator generator change.  Please review instructions located below, under special instructions area.  Follow-Up: Your physician recommends that you schedule a follow-up appointment in: 10-14 days, after your procedure on 10/11/2017, with device clinic for a wound check.  Your physician recommends that you schedule a follow-up appointment in: 91 days, after your procedure on 10/11/2017, with Dr. Curt Bears.  *Please note that any paperwork needing to be filled out by the provider will need to be addressed at the front desk prior to seeing the provider. Please note that any FMLA, disability or other documents regarding health condition is subject to a $25.00 charge that must be received prior to completion of paperwork in the form of a money order or check.  Thank you for choosing [facility]!!   Trinidad Curet, RN [phone]  Any Other Special Instructions Will Be Listed Below (If Applicable).  Implantable Device Instructions  You are scheduled for:                  _____ Implantable Cardioverter Defibrillator  on  10/11/2017  with Dr. Curt Bears.  1.   Please arrive at the [facility], Entrance "A"  at [facility] at  7:30 a.m. on the day of your procedure. (The address is [address])  2. Do not eat or drink after midnight the night before your procedure.  3.   Complete pre procedure  lab work on 10/05/17.  You do not have to be fasting.  4.   A) -Take your last dose of Eliquis on the morning of  10/10/17           - Hold the following medications the morning of your procedure:    1.  Lasix   2.  Glimepiride   3. Metformin           - All of your remaining medications may be taken with a small amount of water the                  morning of your procedure.  5.  Bring your insurance cards and a list of you medications.  6.  Wash your chest and neck with surgical scrub the evening before and the morning of  your procedure.  Rinse well. Please review the surgical scrub instruction sheet given to you.                                                                                                                * If you have ANY questions after you get home, please call Trinidad Curet, RN @ (  [phone].  * Every attempt is made to prevent procedures from being rescheduled.  Due to the nature of  Electrophysiology, rescheduling can happen.  The physician is always aware and directs the staff when this occurs.

## 2017-10-06 LAB — BASIC METABOLIC PANEL
BUN / CREAT RATIO: 12 (ref 12–28)
BUN: 14 mg/dL (ref 8–27)
CALCIUM: 9.5 mg/dL (ref 8.7–10.3)
CO2: 25 mmol/L (ref 20–29)
Chloride: 102 mmol/L (ref 96–106)
Creatinine, Ser: 1.14 mg/dL — ABNORMAL HIGH (ref 0.57–1.00)
GFR calc Af Amer: 57 mL/min/{1.73_m2} — ABNORMAL LOW (ref >59)
GFR calc non Af Amer: 50 mL/min/{1.73_m2} — ABNORMAL LOW (ref >59)
GLUCOSE: 146 mg/dL — AB (ref 65–99)
POTASSIUM: 5.4 mmol/L — AB (ref 3.5–5.2)
Sodium: 142 mmol/L (ref 134–144)

## 2017-10-07 ENCOUNTER — Telehealth: Payer: Self-pay

## 2017-10-07 DIAGNOSIS — Z6837 Body mass index (BMI) 37.0-37.9, adult: Secondary | ICD-10-CM | POA: Diagnosis not present

## 2017-10-07 DIAGNOSIS — E039 Hypothyroidism, unspecified: Secondary | ICD-10-CM | POA: Diagnosis not present

## 2017-10-07 DIAGNOSIS — E875 Hyperkalemia: Secondary | ICD-10-CM | POA: Diagnosis not present

## 2017-10-07 NOTE — Telephone Encounter (Signed)
Per cath lab-Pt procedure to start at 10:30 am.  Requested this nurse call Pt to notify to arrive at 8:30 am 10/11/2017 for procedure instead of 7:30 am.  Left CVM per DPR.  Advised Pt of above.

## 2017-10-11 ENCOUNTER — Ambulatory Visit (HOSPITAL_COMMUNITY)
Admission: RE | Admit: 2017-10-11 | Discharge: 2017-10-11 | Disposition: A | Discharge status: Home / Self Care | Payer: Medicare Other | Source: Ambulatory Visit | Attending: Cardiology | Admitting: Cardiology

## 2017-10-11 ENCOUNTER — Encounter (HOSPITAL_COMMUNITY): Payer: Self-pay | Admitting: Cardiology

## 2017-10-11 ENCOUNTER — Encounter (HOSPITAL_COMMUNITY)
Admission: RE | Disposition: A | Discharge status: Home / Self Care | Payer: Self-pay | Source: Ambulatory Visit | Attending: Cardiology

## 2017-10-11 DIAGNOSIS — Z9889 Other specified postprocedural states: Secondary | ICD-10-CM | POA: Diagnosis not present

## 2017-10-11 DIAGNOSIS — Z833 Family history of diabetes mellitus: Secondary | ICD-10-CM | POA: Insufficient documentation

## 2017-10-11 DIAGNOSIS — I110 Hypertensive heart disease with heart failure: Secondary | ICD-10-CM | POA: Insufficient documentation

## 2017-10-11 DIAGNOSIS — E039 Hypothyroidism, unspecified: Secondary | ICD-10-CM | POA: Insufficient documentation

## 2017-10-11 DIAGNOSIS — I513 Intracardiac thrombosis, not elsewhere classified: Secondary | ICD-10-CM | POA: Insufficient documentation

## 2017-10-11 DIAGNOSIS — I5022 Chronic systolic (congestive) heart failure: Secondary | ICD-10-CM | POA: Insufficient documentation

## 2017-10-11 DIAGNOSIS — R001 Bradycardia, unspecified: Secondary | ICD-10-CM | POA: Diagnosis not present

## 2017-10-11 DIAGNOSIS — I6523 Occlusion and stenosis of bilateral carotid arteries: Secondary | ICD-10-CM | POA: Diagnosis not present

## 2017-10-11 DIAGNOSIS — Z79890 Hormone replacement therapy (postmenopausal): Secondary | ICD-10-CM | POA: Insufficient documentation

## 2017-10-11 DIAGNOSIS — Z955 Presence of coronary angioplasty implant and graft: Secondary | ICD-10-CM | POA: Insufficient documentation

## 2017-10-11 DIAGNOSIS — Z7901 Long term (current) use of anticoagulants: Secondary | ICD-10-CM | POA: Diagnosis not present

## 2017-10-11 DIAGNOSIS — E785 Hyperlipidemia, unspecified: Secondary | ICD-10-CM | POA: Insufficient documentation

## 2017-10-11 DIAGNOSIS — I252 Old myocardial infarction: Secondary | ICD-10-CM | POA: Diagnosis not present

## 2017-10-11 DIAGNOSIS — E119 Type 2 diabetes mellitus without complications: Secondary | ICD-10-CM | POA: Insufficient documentation

## 2017-10-11 DIAGNOSIS — Z79899 Other long term (current) drug therapy: Secondary | ICD-10-CM | POA: Diagnosis not present

## 2017-10-11 DIAGNOSIS — Z823 Family history of stroke: Secondary | ICD-10-CM | POA: Insufficient documentation

## 2017-10-11 DIAGNOSIS — Z45010 Encounter for checking and testing of cardiac pacemaker pulse generator [battery]: Secondary | ICD-10-CM

## 2017-10-11 DIAGNOSIS — Z7984 Long term (current) use of oral hypoglycemic drugs: Secondary | ICD-10-CM | POA: Insufficient documentation

## 2017-10-11 DIAGNOSIS — I429 Cardiomyopathy, unspecified: Secondary | ICD-10-CM | POA: Insufficient documentation

## 2017-10-11 DIAGNOSIS — Z8249 Family history of ischemic heart disease and other diseases of the circulatory system: Secondary | ICD-10-CM | POA: Insufficient documentation

## 2017-10-11 HISTORY — PX: ICD GENERATOR CHANGEOUT: EP1231

## 2017-10-11 LAB — SURGICAL PCR SCREEN
MRSA, PCR: NEGATIVE
Staphylococcus aureus: POSITIVE — AB

## 2017-10-11 LAB — GLUCOSE, CAPILLARY: GLUCOSE-CAPILLARY: 139 mg/dL — AB (ref 70–99)

## 2017-10-11 SURGERY — ICD GENERATOR CHANGEOUT

## 2017-10-11 MED ORDER — LIDOCAINE HCL (PF) 1 % IJ SOLN
INTRAMUSCULAR | Status: AC
  Filled 2017-10-11: qty 30

## 2017-10-11 MED ORDER — CEFAZOLIN SODIUM-DEXTROSE 2-4 GM/100ML-% IV SOLN
2.0000 g | INTRAVENOUS | Status: AC
  Administered 2017-10-11: 2 g via INTRAVENOUS
  Filled 2017-10-11: qty 100

## 2017-10-11 MED ORDER — SODIUM CHLORIDE 0.9 % IV SOLN
INTRAVENOUS | Status: DC
  Administered 2017-10-11: 10:00:00 via INTRAVENOUS

## 2017-10-11 MED ORDER — CEFAZOLIN SODIUM-DEXTROSE 2-4 GM/100ML-% IV SOLN
INTRAVENOUS | Status: AC
  Filled 2017-10-11: qty 100

## 2017-10-11 MED ORDER — SODIUM CHLORIDE 0.9 % IV SOLN
INTRAVENOUS | Status: AC
  Filled 2017-10-11: qty 2

## 2017-10-11 MED ORDER — FENTANYL CITRATE (PF) 100 MCG/2ML IJ SOLN
INTRAMUSCULAR | Status: AC
  Filled 2017-10-11: qty 2

## 2017-10-11 MED ORDER — ONDANSETRON HCL 4 MG/2ML IJ SOLN: 4.0000 mg | Freq: Four times a day (QID) | INTRAMUSCULAR | Status: DC | PRN

## 2017-10-11 MED ORDER — LIDOCAINE HCL (PF) 1 % IJ SOLN
INTRAMUSCULAR | Status: DC | PRN
  Administered 2017-10-11: 45 mL
  Administered 2017-10-11: 15 mL

## 2017-10-11 MED ORDER — ACETAMINOPHEN 325 MG PO TABS: 325.0000 mg | ORAL_TABLET | ORAL | Status: DC | PRN

## 2017-10-11 MED ORDER — MUPIROCIN 2 % EX OINT
TOPICAL_OINTMENT | CUTANEOUS | Status: AC
  Administered 2017-10-11: 09:00:00
  Filled 2017-10-11: qty 22

## 2017-10-11 MED ORDER — MIDAZOLAM HCL 5 MG/5ML IJ SOLN
INTRAMUSCULAR | Status: AC
  Filled 2017-10-11: qty 5

## 2017-10-11 MED ORDER — SODIUM CHLORIDE 0.9 % IV SOLN
80.0000 mg | INTRAVENOUS | Status: AC
  Administered 2017-10-11: 80 mg
  Filled 2017-10-11: qty 2

## 2017-10-11 MED ORDER — FENTANYL CITRATE (PF) 100 MCG/2ML IJ SOLN
INTRAMUSCULAR | Status: DC | PRN
  Administered 2017-10-11: 12.5 ug via INTRAVENOUS
  Administered 2017-10-11: 25 ug via INTRAVENOUS

## 2017-10-11 MED ORDER — MIDAZOLAM HCL 5 MG/5ML IJ SOLN
INTRAMUSCULAR | Status: DC | PRN
  Administered 2017-10-11 (×2): 1 mg via INTRAVENOUS

## 2017-10-11 SURGICAL SUPPLY — 5 items
CABLE SURGICAL S-101-97-12 (CABLE) ×3 IMPLANT
ICD EVERA XT MRI DF1  DDMB1D1 (ICD Generator) ×2 IMPLANT
ICD EVERA XT MRI DF1 DDMB1D1 (ICD Generator) ×1 IMPLANT
PAD PRO RADIOLUCENT 2001M-C (PAD) ×3 IMPLANT
TRAY PACEMAKER INSERTION (PACKS) ×3 IMPLANT

## 2017-10-11 NOTE — Discharge Instructions (Signed)
Pacemaker Battery Change, Care After °This sheet gives you information about how to care for yourself after your procedure. Your health care provider may also give you more specific instructions. If you have problems or questions, contact your health care provider. °What can I expect after the procedure? °After your procedure, it is common to have: °· Pain or soreness at the site where the pacemaker was inserted. °· Swelling at the site where the pacemaker was inserted. ° °Follow these instructions at home: °Incision care °· Keep the incision clean and dry. °? Do not take baths, swim, or use a hot tub until your health care provider approves. °? You may shower the day after your procedure, or as directed by your health care provider. °? Pat the area dry with a clean towel. Do not rub the area. This may cause bleeding. °· Follow instructions from your health care provider about how to take care of your incision. Make sure you: °? Wash your hands with soap and water before you change your bandage (dressing). If soap and water are not available, use hand sanitizer. °? Change your dressing as told by your health care provider. °? Leave stitches (sutures), skin glue, or adhesive strips in place. These skin closures may need to stay in place for 2 weeks or longer. If adhesive strip edges start to loosen and curl up, you may trim the loose edges. Do not remove adhesive strips completely unless your health care provider tells you to do that. °· Check your incision area every day for signs of infection. Check for: °? More redness, swelling, or pain. °? More fluid or blood. °? Warmth. °? Pus or a bad smell. °Activity °· Do not lift anything that is heavier than 10 lb (4.5 kg) until your health care provider says it is okay to do so. °· For the first 2 weeks, or as long as told by your health care provider: °? Avoid lifting your left arm higher than your shoulder. °? Be gentle when you move your arms over your head. It is okay  to raise your arm to comb your hair. °? Avoid strenuous exercise. °· Ask your health care provider when it is okay to: °? Resume your normal activities. °? Return to work or school. °? Resume sexual activity. °Eating and drinking °· Eat a heart-healthy diet. This should include plenty of fresh fruits and vegetables, whole grains, low-fat dairy products, and lean protein like chicken and fish. °· Limit alcohol intake to no more than 1 drink a day for non-pregnant women and 2 drinks a day for men. One drink equals 12 oz of beer, 5 oz of wine, or 1½ oz of hard liquor. °· Check ingredients and nutrition facts on packaged foods and beverages. Avoid the following types of food: °? Food that is high in salt (sodium). °? Food that is high in saturated fat, like full-fat dairy or red meat. °? Food that is high in trans fat, like fried food. °? Food and drinks that are high in sugar. °Lifestyle °· Do not use any products that contain nicotine or tobacco, such as cigarettes and e-cigarettes. If you need help quitting, ask your health care provider. °· Take steps to manage and control your weight. °· Get regular exercise. Aim for 150 minutes of moderate-intensity exercise (such as walking or yoga) or 75 minutes of vigorous exercise (such as running or swimming) each week. °· Manage other health problems, such as diabetes or high blood pressure. Ask your health   care provider how you can manage these conditions. °General instructions °· Do not drive for 24 hours after your procedure if you were given a medicine to help you relax (sedative). °· Take over-the-counter and prescription medicines only as told by your health care provider. °· Avoid putting pressure on the area where the pacemaker was placed. °· If you need an MRI after your pacemaker has been placed, be sure to tell the health care provider who orders the MRI that you have a pacemaker. °· Avoid close and prolonged exposure to electrical devices that have strong  magnetic fields. These include: °? Cell phones. Avoid keeping them in a pocket near the pacemaker, and try using the ear opposite the pacemaker. °? MP3 players. °? Household appliances, like microwaves. °? Metal detectors. °? Electric generators. °? High-tension wires. °· Keep all follow-up visits as directed by your health care provider. This is important. °Contact a health care provider if: °· You have pain at the incision site that is not relieved by over-the-counter or prescription medicines. °· You have any of these around your incision site or coming from it: °? More redness, swelling, or pain. °? Fluid or blood. °? Warmth to the touch. °? Pus or a bad smell. °· You have a fever. °· You feel brief, occasional palpitations, light-headedness, or any symptoms that you think might be related to your heart. °Get help right away if: °· You experience chest pain that is different from the pain at the pacemaker site. °· You develop a red streak that extends above or below the incision site. °· You experience shortness of breath. °· You have palpitations or an irregular heartbeat. °· You have light-headedness that does not go away quickly. °· You faint or have dizzy spells. °· Your pulse suddenly drops or increases rapidly and does not return to normal. °· You begin to gain weight and your legs and ankles swell. °Summary °· After your procedure, it is common to have pain, soreness, and some swelling where the pacemaker was inserted. °· Make sure to keep your incision clean and dry. Follow instructions from your health care provider about how to take care of your incision. °· Check your incision every day for signs of infection, such as more pain or swelling, pus or a bad smell, warmth, or leaking fluid and blood. °· Avoid strenuous exercise and lifting your left arm higher than your shoulder for 2 weeks, or as long as told by your health care provider. °This information is not intended to replace advice given to you by  your health care provider. Make sure you discuss any questions you have with your health care provider. °Document Released: 11/23/2012 Document Revised: 12/26/2015 Document Reviewed: 12/26/2015 °Elsevier Interactive Patient Education © 2017 [employer] °Moderate Conscious Sedation, Adult, Care After °These instructions provide you with information about caring for yourself after your procedure. Your health care provider may also give you more specific instructions. Your treatment has been planned according to current medical practices, but problems sometimes occur. Call your health care provider if you have any problems or questions after your procedure. °What can I expect after the procedure? °After your procedure, it is common: °· To feel sleepy for several hours. °· To feel clumsy and have poor balance for several hours. °· To have poor judgment for several hours. °· To vomit if you eat too soon. ° °Follow these instructions at home: °For at least 24 hours after the procedure: ° °· Do not: °? Participate in   activities where you could fall or become injured. °? Drive. °? Use heavy machinery. °? Drink alcohol. °? Take sleeping pills or medicines that cause drowsiness. °? Make important decisions or sign legal documents. °? Take care of children on your own. °· Rest. °Eating and drinking °· Follow the diet recommended by your health care provider. °· If you vomit: °? Drink water, juice, or soup when you can drink without vomiting. °? Make sure you have little or no nausea before eating solid foods. °General instructions °· Have a responsible adult stay with you until you are awake and alert. °· Take over-the-counter and prescription medicines only as told by your health care provider. °· If you smoke, do not smoke without supervision. °· Keep all follow-up visits as told by your health care provider. This is important. °Contact a health care provider if: °· You keep feeling nauseous or you keep vomiting. °· You  feel light-headed. °· You develop a rash. °· You have a fever. °Get help right away if: °· You have trouble breathing. °This information is not intended to replace advice given to you by your health care provider. Make sure you discuss any questions you have with your health care provider. °Document Released: 11/23/2012 Document Revised: 07/08/2015 Document Reviewed: 05/25/2015 °Elsevier Interactive Patient Education © 2018 [employer] ° °

## 2017-10-11 NOTE — H&P (Signed)
Christina Perry has presented today for surgery, with the diagnosis of CHF.  The various methods of treatment have been discussed with the patient and family. After consideration of risks, benefits and other options for treatment, the patient has consented to  Procedure(s): CRT-D generator change as a surgical intervention .  Risks include but not limited to bleeding, tamponade, infection, pneumothorax, among others. The patient's history has been reviewed, patient examined, no change in status, stable for surgery.  I have reviewed the patient's chart and labs.  Questions were answered to the patient's satisfaction.    Lennette Fader Curt Bears, MD 10/11/2017 10:21 AM  ICD Criteria  Current LVEF:45%. Within 12 months prior to implant: Yes   Heart failure history: Yes, Class II  Cardiomyopathy history: Yes, Ischemic Cardiomyopathy - Prior MI.  Atrial Fibrillation/Atrial Flutter: No.  Ventricular tachycardia history: No.  Cardiac arrest history: No.  History of syndromes with risk of sudden death: No.  Previous ICD: Yes, Reason for ICD:  Primary prevention.  Current ICD indication: Primary  PPM indication: No.   Class I or II Bradycardia indication present: No  Beta Blocker therapy for 3 or more months: Yes, prescribed.   Ace Inhibitor/ARB therapy for 3 or more months: Yes, prescribed.

## 2017-10-11 NOTE — Progress Notes (Signed)
Patient instructed to resume Eliquis tonight per Dr Curt Bears

## 2017-10-12 ENCOUNTER — Telehealth: Payer: Self-pay | Admitting: Cardiology

## 2017-10-12 NOTE — Telephone Encounter (Signed)
Left voicemail encouraging patient to take extra strength tylenol.

## 2017-10-12 NOTE — Telephone Encounter (Signed)
Wants to know what she can take for pain with her recent pacemaker

## 2017-10-13 ENCOUNTER — Other Ambulatory Visit: Payer: Self-pay | Admitting: Cardiology

## 2017-10-14 ENCOUNTER — Encounter (HOSPITAL_COMMUNITY): Payer: Self-pay | Admitting: Certified Registered"

## 2017-10-25 ENCOUNTER — Ambulatory Visit (INDEPENDENT_AMBULATORY_CARE_PROVIDER_SITE_OTHER): Payer: Medicare Other | Admitting: *Deleted

## 2017-10-25 DIAGNOSIS — Z95810 Presence of automatic (implantable) cardiac defibrillator: Secondary | ICD-10-CM | POA: Diagnosis not present

## 2017-10-25 DIAGNOSIS — I255 Ischemic cardiomyopathy: Secondary | ICD-10-CM | POA: Diagnosis not present

## 2017-10-25 LAB — CUP PACEART INCLINIC DEVICE CHECK
Battery Remaining Longevity: 105 mo
Brady Statistic AP VS Percent: 53.34 %
Brady Statistic AS VS Percent: 46.66 %
HighPow Impedance: 45 Ohm
HighPow Impedance: 63 Ohm
Implantable Lead Implant Date: 20111212
Implantable Lead Model: 6947
Lead Channel Impedance Value: 304 Ohm
Lead Channel Impedance Value: 342 Ohm
Lead Channel Impedance Value: 399 Ohm
Lead Channel Pacing Threshold Amplitude: 2.75 V
Lead Channel Sensing Intrinsic Amplitude: 0.9 mV
Lead Channel Sensing Intrinsic Amplitude: 5.625 mV
Lead Channel Sensing Intrinsic Amplitude: 6.125 mV
Lead Channel Setting Pacing Amplitude: 3 V
Lead Channel Setting Pacing Pulse Width: 1 ms
Lead Channel Setting Sensing Sensitivity: 0.3 mV
MDC IDC LEAD IMPLANT DT: 20111212
MDC IDC LEAD LOCATION: 753859
MDC IDC LEAD LOCATION: 753860
MDC IDC MSMT BATTERY VOLTAGE: 3.05 V
MDC IDC MSMT LEADCHNL RA PACING THRESHOLD AMPLITUDE: 1.5 V
MDC IDC MSMT LEADCHNL RA PACING THRESHOLD PULSEWIDTH: 0.4 ms
MDC IDC MSMT LEADCHNL RV PACING THRESHOLD PULSEWIDTH: 1 ms
MDC IDC PG IMPLANT DT: 20190826
MDC IDC SESS DTM: 20190909120021
MDC IDC SET LEADCHNL RV PACING AMPLITUDE: 5 V
MDC IDC STAT BRADY AP VP PERCENT: 0 %
MDC IDC STAT BRADY AS VP PERCENT: 0 %
MDC IDC STAT BRADY RA PERCENT PACED: 53.02 %
MDC IDC STAT BRADY RV PERCENT PACED: 0 %

## 2017-10-25 NOTE — Progress Notes (Signed)
Wound check appointment. Steri-strips removed. Wound without redness or edema. Incision edges approximated, wound well healed. Normal device function. Thresholds, sensing, and impedances consistent with implant (generator replacement) measurements. Histogram distribution appropriate for patient and level of activity. No mode switches or ventricular arrhythmias noted. Patient educated about wound care, shock plan and Carelink monitoring. ROV with WC/A 01/24/18.  Patient c/o not being numb during procedure and feeling the scalpel. She also did not receive post-op instructions at discharge for activity restrictions/ pain management, she would like for it to be noted in her EMR.

## 2017-11-22 DIAGNOSIS — Z23 Encounter for immunization: Secondary | ICD-10-CM | POA: Diagnosis not present

## 2017-12-06 DIAGNOSIS — E039 Hypothyroidism, unspecified: Secondary | ICD-10-CM | POA: Diagnosis not present

## 2017-12-06 DIAGNOSIS — E875 Hyperkalemia: Secondary | ICD-10-CM | POA: Diagnosis not present

## 2018-01-12 DIAGNOSIS — Z6837 Body mass index (BMI) 37.0-37.9, adult: Secondary | ICD-10-CM | POA: Diagnosis not present

## 2018-01-12 DIAGNOSIS — E1165 Type 2 diabetes mellitus with hyperglycemia: Secondary | ICD-10-CM | POA: Diagnosis not present

## 2018-01-12 DIAGNOSIS — E039 Hypothyroidism, unspecified: Secondary | ICD-10-CM | POA: Diagnosis not present

## 2018-01-12 DIAGNOSIS — E785 Hyperlipidemia, unspecified: Secondary | ICD-10-CM | POA: Diagnosis not present

## 2018-01-12 DIAGNOSIS — R05 Cough: Secondary | ICD-10-CM | POA: Diagnosis not present

## 2018-01-12 DIAGNOSIS — I10 Essential (primary) hypertension: Secondary | ICD-10-CM | POA: Diagnosis not present

## 2018-01-12 DIAGNOSIS — E669 Obesity, unspecified: Secondary | ICD-10-CM | POA: Diagnosis not present

## 2018-01-12 DIAGNOSIS — Z79899 Other long term (current) drug therapy: Secondary | ICD-10-CM | POA: Diagnosis not present

## 2018-01-20 DIAGNOSIS — R05 Cough: Secondary | ICD-10-CM | POA: Diagnosis not present

## 2018-01-24 ENCOUNTER — Encounter: Payer: Self-pay | Admitting: Cardiology

## 2018-01-24 ENCOUNTER — Ambulatory Visit (INDEPENDENT_AMBULATORY_CARE_PROVIDER_SITE_OTHER): Payer: Medicare Other | Admitting: Cardiology

## 2018-01-24 ENCOUNTER — Ambulatory Visit (INDEPENDENT_AMBULATORY_CARE_PROVIDER_SITE_OTHER): Payer: Medicare Other

## 2018-01-24 VITALS — BP 124/64 | HR 66 | Ht 63.0 in | Wt 198.0 lb

## 2018-01-24 DIAGNOSIS — I10 Essential (primary) hypertension: Secondary | ICD-10-CM

## 2018-01-24 DIAGNOSIS — E785 Hyperlipidemia, unspecified: Secondary | ICD-10-CM

## 2018-01-24 DIAGNOSIS — I240 Acute coronary thrombosis not resulting in myocardial infarction: Secondary | ICD-10-CM

## 2018-01-24 DIAGNOSIS — I255 Ischemic cardiomyopathy: Secondary | ICD-10-CM

## 2018-01-24 DIAGNOSIS — I5022 Chronic systolic (congestive) heart failure: Secondary | ICD-10-CM | POA: Diagnosis not present

## 2018-01-24 DIAGNOSIS — I513 Intracardiac thrombosis, not elsewhere classified: Secondary | ICD-10-CM | POA: Diagnosis not present

## 2018-01-24 DIAGNOSIS — I2510 Atherosclerotic heart disease of native coronary artery without angina pectoris: Secondary | ICD-10-CM

## 2018-01-24 NOTE — Patient Instructions (Signed)
Medication Instructions:  Your physician recommends that you continue on your current medications as directed. Please refer to the Current Medication list given to you today.  *If you need a refill on your cardiac medications before your next appointment, please call your pharmacy*  Labwork: None ordered  Testing/Procedures: None ordered  Follow-Up: Remote monitoring is used to monitor your Pacemaker or ICD from home. This monitoring reduces the number of office visits required to check your device to one time per year. It allows [place] to keep an eye on the functioning of your device to ensure it is working properly. You are scheduled for a device check from home on 04/25/2018. You may send your transmission at any time that day. If you have a wireless device, the transmission will be sent automatically. After your physician reviews your transmission, you will receive a postcard with your next transmission date.  Your physician wants you to follow-up in: 9 months with Dr. Curt Bears.  You will receive a reminder letter in the mail two months in advance. If you don't receive a letter, please call our office to schedule the follow-up appointment.  Thank you for choosing [facility]!!   Trinidad Curet, RN [phone]  Any Other Special Instructions Will Be Listed Below (If Applicable).

## 2018-01-24 NOTE — Progress Notes (Signed)
Electrophysiology Office Note   Date:  01/24/2018   ID:  Christina Perry, DOB 1950/07/07, MRN [id number]  PCP:  Ernestene Kiel, MD  Cardiologist:  Agustin Cree Primary Electrophysiologist:  Constance Haw, MD    No chief complaint on file.    History of Present Illness: Christina Perry is a 67 y.o. female who is being seen today for the evaluation of ischemic cardiomyopathy at the request of Ernestene Kiel, MD. Presenting today for electrophysiology evaluation.  She has a history of an ischemic cardiomyopathy with a myocardial infarction at the age of 8, type 2 diabetes, hypertension, and hyperlipidemia.  She presents for evaluation of her ICD.  Today, denies symptoms of palpitations, chest pain, shortness of breath, orthopnea, PND, lower extremity edema, claudication, dizziness, presyncope, syncope, bleeding, or neurologic sequela. The patient is tolerating medications without difficulties.  Overall she is doing well.  She has no chest pain or shortness of breath.  She does complain that she felt the initial incision during her generator change, but otherwise has done well.   Past Medical History:  Diagnosis Date  . Anxiety   . Cardiomyopathy ([place])   . Coronary artery stenosis   . Depression   . Diabetes mellitus without complication ([place])   . Hyperlipidemia   . Hypertension   . MI (myocardial infarction) ([place])   . Thyroid disease    Past Surgical History:  Procedure Laterality Date  . CAROTID STENT    . CESAREAN SECTION    . CORONARY ANGIOPLASTY    . ICD GENERATOR CHANGEOUT N/A 10/11/2017   Procedure: ICD GENERATOR CHANGEOUT;  Surgeon: Constance Haw, MD;  Location: [facility];  Service: Cardiovascular;  Laterality: N/A;  . ICD Placement       Current Outpatient Medications  Medication Sig Dispense Refill  . apixaban (ELIQUIS) 5 MG TABS tablet Take 1 tablet (5 mg total) by mouth 2 (two) times daily. 180 tablet 2  . atorvastatin (LIPITOR) 20 MG  tablet Take 20 mg by mouth daily.    . carvedilol (COREG) 12.5 MG tablet Take 12.5 mg by mouth 2 (two) times daily.     . ferrous sulfate 325 (65 FE) MG tablet Take 325 mg by mouth daily with breakfast.    . FIBER SELECT GUMMIES PO Take 1 tablet by mouth daily.    . furosemide (LASIX) 40 MG tablet Take 40 mg by mouth See admin instructions. Take 40mg  daily as needed or twice weekly on Monday and Thursday    . glimepiride (AMARYL) 4 MG tablet Take 4 mg by mouth daily.    Marland Kitchen levothyroxine (SYNTHROID, LEVOTHROID) 150 MCG tablet Take 150 mcg by mouth daily before breakfast.    . Magnesium 500 MG TABS Take 1,000 mg by mouth 2 (two) times daily.     . Melatonin 3 MG TABS Take 3 mg by mouth at bedtime as needed (sleep).     . metFORMIN (GLUCOPHAGE) 1000 MG tablet Take 1,000 mg by mouth 2 (two) times daily.    . Multiple Vitamin (MULTI-VITAMINS) TABS Take 1 tablet by mouth daily.    Marland Kitchen PARoxetine (PAXIL) 10 MG tablet Take 10 mg by mouth daily.     No current facility-administered medications for this visit.     Allergies:   Patient has no known allergies.   Social History:  The patient  reports that she has never smoked. She has never used smokeless tobacco. She reports that she drinks alcohol. She reports that she  does not use drugs.   Family History:  The patient's family history includes CAD in her mother; COPD in her mother; Diabetes in her mother; Stroke in her mother.    ROS:  Please see the history of present illness.   Otherwise, review of systems is positive for none.   All other systems are reviewed and negative.   PHYSICAL EXAM: VS:  BP 124/64   Pulse 66   Ht 5\' 3"  (1.6 m)   Wt 198 lb (89.8 kg)   BMI 35.07 kg/m  , BMI Body mass index is 35.07 kg/m. GEN: Well nourished, well developed, in no acute distress  HEENT: normal  Neck: no JVD, carotid bruits, or masses Cardiac: RRR; no murmurs, rubs, or gallops,no edema  Respiratory:  clear to auscultation bilaterally, normal work of  breathing GI: soft, nontender, nondistended, + BS MS: no deformity or atrophy  Skin: warm and dry, device site well healed Neuro:  Strength and sensation are intact Psych: euthymic mood, full affect  EKG:  EKG is ordered today. Personal review of the ekg ordered shows A paced, RAD, lateral Q waves  Personal review of the device interrogation today. Results in [place]: 09/23/2017: Hemoglobin 12.4; Platelets 258 10/05/2017: BUN 14; Creatinine, Ser 1.14; Potassium 5.4; Sodium 142    Lipid Panel  No results found for: CHOL, TRIG, HDL, CHOLHDL, VLDL, LDLCALC, LDLDIRECT   Wt Readings from Last 3 Encounters:  01/24/18 198 lb (89.8 kg)  10/11/17 205 lb (93 kg)  10/05/17 205 lb 9.6 oz (93.3 kg)      Other studies Reviewed: Additional studies/ records that were reviewed today include: TTE 05/03/17 Left atrial enlargement Mild LV systolic dysfunction EF 34% with mid distal septal akinesis, anterior hypokinesis, apical akinesis Normal right heart structures Trace mitral regurgitation  ASSESSMENT AND PLAN:  1.  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  Currently on optimal medical therapy.  Device functioning appropriately.  No changes at this time.  2.  Hypertension: Currently well controlled.  No changes.  3.  Hyperlipidemia: Continue Lipitor  4.  Coronary artery disease: No current chest pain.  Per primary cardiology.    5.  LV thrombus: Continue Eliquis per primary cardiology  Current medicines are reviewed at length with the patient today.   The patient does not have concerns regarding her medicines.  The following changes were made today: None  Labs/ tests ordered today include:  Orders Placed This Encounter  Procedures  . EKG 12-Lead     Disposition:   FU with Deidre Carino 9 months  Signed, Aydenn Gervin Meredith Leeds, MD  01/24/2018 10:03 AM     [facility] [address] [place] [address] [phone] (office) [phone] (fax)

## 2018-01-25 NOTE — Progress Notes (Signed)
Remote ICD transmission.   

## 2018-02-14 ENCOUNTER — Ambulatory Visit: Payer: Medicare Other | Admitting: Cardiology

## 2018-02-14 DIAGNOSIS — R062 Wheezing: Secondary | ICD-10-CM | POA: Diagnosis not present

## 2018-02-14 DIAGNOSIS — Z6835 Body mass index (BMI) 35.0-35.9, adult: Secondary | ICD-10-CM | POA: Diagnosis not present

## 2018-02-14 DIAGNOSIS — R05 Cough: Secondary | ICD-10-CM | POA: Diagnosis not present

## 2018-02-14 DIAGNOSIS — J40 Bronchitis, not specified as acute or chronic: Secondary | ICD-10-CM | POA: Diagnosis not present

## 2018-02-28 DIAGNOSIS — L570 Actinic keratosis: Secondary | ICD-10-CM | POA: Diagnosis not present

## 2018-02-28 DIAGNOSIS — L309 Dermatitis, unspecified: Secondary | ICD-10-CM | POA: Diagnosis not present

## 2018-03-09 LAB — CUP PACEART REMOTE DEVICE CHECK
Battery Voltage: 3.03 V
Brady Statistic AP VP Percent: 0 %
Brady Statistic AP VS Percent: 44.76 %
Brady Statistic AS VP Percent: 0 %
Brady Statistic AS VS Percent: 55.23 %
HighPow Impedance: 50 Ohm
HighPow Impedance: 61 Ohm
Implantable Lead Implant Date: 20111212
Implantable Lead Location: 753859
Implantable Pulse Generator Implant Date: 20190826
Lead Channel Impedance Value: 304 Ohm
Lead Channel Impedance Value: 361 Ohm
Lead Channel Impedance Value: 418 Ohm
Lead Channel Pacing Threshold Amplitude: 1.625 V
Lead Channel Sensing Intrinsic Amplitude: 6.625 mV
Lead Channel Sensing Intrinsic Amplitude: 6.625 mV
Lead Channel Setting Pacing Amplitude: 3.25 V
Lead Channel Setting Pacing Amplitude: 5 V
Lead Channel Setting Pacing Pulse Width: 1 ms
Lead Channel Setting Sensing Sensitivity: 0.3 mV
MDC IDC LEAD IMPLANT DT: 20111212
MDC IDC LEAD LOCATION: 753860
MDC IDC MSMT BATTERY REMAINING LONGEVITY: 105 mo
MDC IDC MSMT LEADCHNL RA PACING THRESHOLD PULSEWIDTH: 0.4 ms
MDC IDC MSMT LEADCHNL RA SENSING INTR AMPL: 0.75 mV
MDC IDC MSMT LEADCHNL RA SENSING INTR AMPL: 0.75 mV
MDC IDC SESS DTM: 20191209072823
MDC IDC STAT BRADY RA PERCENT PACED: 44.46 %
MDC IDC STAT BRADY RV PERCENT PACED: 0 %

## 2018-04-06 ENCOUNTER — Encounter: Payer: Self-pay | Admitting: Allergy and Immunology

## 2018-04-06 ENCOUNTER — Ambulatory Visit (INDEPENDENT_AMBULATORY_CARE_PROVIDER_SITE_OTHER): Payer: Medicare Other | Admitting: Allergy and Immunology

## 2018-04-06 VITALS — BP 160/88 | HR 68 | Temp 98.3°F | Resp 16 | Ht 61.5 in | Wt 194.8 lb

## 2018-04-06 DIAGNOSIS — K219 Gastro-esophageal reflux disease without esophagitis: Secondary | ICD-10-CM | POA: Diagnosis not present

## 2018-04-06 DIAGNOSIS — J4530 Mild persistent asthma, uncomplicated: Secondary | ICD-10-CM

## 2018-04-06 DIAGNOSIS — J3089 Other allergic rhinitis: Secondary | ICD-10-CM | POA: Diagnosis not present

## 2018-04-06 MED ORDER — PANTOPRAZOLE SODIUM 40 MG PO TBEC: 40.0000 mg | DELAYED_RELEASE_TABLET | Freq: Two times a day (BID) | ORAL | 5 refills | Status: DC

## 2018-04-06 MED ORDER — FAMOTIDINE 40 MG PO TABS: 40.0000 mg | ORAL_TABLET | Freq: Every day | ORAL | 5 refills | Status: DC

## 2018-04-06 MED ORDER — ALBUTEROL SULFATE HFA 108 (90 BASE) MCG/ACT IN AERS: INHALATION_SPRAY | RESPIRATORY_TRACT | 1 refills | Status: DC

## 2018-04-06 NOTE — Progress Notes (Signed)
Dear Dr. Laqueta Due,  Thank you for referring Christina Perry to the [facility] on 04/06/2018.   Below is a summation of this patient's evaluation and recommendations.  Thank you for your referral. I will keep you informed about this patient's response to treatment.   If you have any questions please do not hesitate to contact me.   Sincerely,  Jiles Prows, MD Allergy / Immunology [place]   ______________________________________________________________________    NEW PATIENT NOTE  Referring Provider: Ernestene Kiel, MD Primary Provider: Ernestene Kiel, MD Date of office visit: 04/06/2018    Subjective:   Chief Complaint:  Christina Perry (DOB: October 18, 1950) is a 68 y.o. female who presents to the clinic on 04/06/2018 with a chief complaint of Cough .     HPI: Christina Perry presents to this clinic in evaluation of cough.  Apparently 6 months ago she developed a "cold" with nasal congestion and sneezing and coughing.  Although her upper airway symptoms have completely abated she still continues to cough.  She coughs especially while singing and especially while utilizing low notes while singing.  She feels as though the tone of her voice is very scratchy and she does have throat clearing and she has a sensation that there is "phlegm on her vocal cords".  She does not have spells of cough associated with posttussive emesis or posttussive urination.  She does not have any classic reflux symptoms.  She does have some chronic nasal congestion which has been a longstanding issue without any sneezing or nose blowing or anosmia.    Apparently a chest x-ray has been normal.  Empiric therapy has included the administration of a Kenalog injection on 2 occasions, azithromycin, omeprazole for 1 month, Singulair for 1 month none of which has helped her issue.  Past Medical History:  Diagnosis  Date  . Anxiety   . Cardiomyopathy ([place])   . Coronary artery stenosis   . Depression   . Diabetes mellitus without complication ([place])   . Hyperlipidemia   . Hypertension   . MI (myocardial infarction) ([place])   . Thyroid disease     Past Surgical History:  Procedure Laterality Date  . [place] SURGERY  2005  . CAROTID STENT    . CESAREAN SECTION  1979  . CORONARY ANGIOPLASTY    . ICD GENERATOR CHANGEOUT N/A 10/11/2017   Procedure: ICD GENERATOR CHANGEOUT;  Surgeon: Constance Haw, MD;  Location: [facility];  Service: Cardiovascular;  Laterality: N/A;  . ICD Placement    . TONSILLECTOMY      Allergies as of 04/06/2018      Reactions   Cozaar [losartan] Other (See Comments)   Gave her asthma-like symptoms   Lisinopril Cough      Medication List      apixaban 5 MG Tabs tablet Commonly known as:  ELIQUIS Take 1 tablet (5 mg total) by mouth 2 (two) times daily.   atorvastatin 20 MG tablet Commonly known as:  LIPITOR Take 20 mg by mouth daily.   carvedilol 12.5 MG tablet Commonly known as:  COREG Take 12.5 mg by mouth 2 (two) times daily.   glimepiride 4 MG tablet Commonly known as:  AMARYL Take 4 mg by mouth daily.   levothyroxine 175 MCG tablet Commonly known as:  SYNTHROID, LEVOTHROID TAKE 1 TABLET BY MOUTH ONCE DAILY ON AN EMPTY STOMACH IN THE MORNING  Magnesium 500 MG Tabs Take 1,000 mg by mouth 2 (two) times daily.   metFORMIN 1000 MG tablet Commonly known as:  GLUCOPHAGE Take 1,000 mg by mouth 2 (two) times daily.   PARoxetine 10 MG tablet Commonly known as:  PAXIL Take 10 mg by mouth daily.       Review of systems negative except as noted in HPI / PMHx or noted below:  Review of Systems  Constitutional: Negative.   HENT: Negative.   Eyes: Negative.   Respiratory: Negative.   Cardiovascular: Negative.   Gastrointestinal: Negative.   Genitourinary: Negative.   Musculoskeletal: Negative.   Skin: Negative.   Neurological:  Negative.   Endo/Heme/Allergies: Negative.   Psychiatric/Behavioral: Negative.     Family History  Problem Relation Age of Onset  . COPD Mother   . Diabetes Mother   . CAD Mother   . Stroke Mother   . Diabetes Maternal Grandmother   . Stroke Maternal Grandmother     Social History   Socioeconomic History  . Marital status: Married    Spouse name: Not on file  . Number of children: Not on file  . Years of education: Not on file  . Highest education level: Not on file  Occupational History  . Not on file  Social Needs  . Financial resource strain: Not on file  . Food insecurity:    Worry: Not on file    Inability: Not on file  . Transportation needs:    Medical: Not on file    Non-medical: Not on file  Tobacco Use  . Smoking status: Never Smoker  . Smokeless tobacco: Never Used  Substance and Sexual Activity  . Alcohol use: Yes  . Drug use: No  . Sexual activity: Not on file  Lifestyle  . Physical activity:    Days per week: Not on file    Minutes per session: Not on file  . Stress: Not on file  Relationships  . Social connections:    Talks on phone: Not on file    Gets together: Not on file    Attends religious service: Not on file    Active member of club or organization: Not on file    Attends meetings of clubs or organizations: Not on file    Relationship status: Not on file  . Intimate partner violence:    Fear of current or ex partner: Not on file    Emotionally abused: Not on file    Physically abused: Not on file    Forced sexual activity: Not on file  Other Topics Concern  . Not on file  Social History Narrative  . Not on file    Environmental and Social history  Lives in a house with a dry environment, cats and dogs located inside the household, no carpet in the bedroom, no plastic on the bed, no plastic on the pillow, and no smoking ongoing with inside the household.  She is a retired Marine scientist.  Objective:   Vitals:   04/06/18 1358  BP:  (!) 160/88  Pulse: 68  Resp: 16  Temp: 98.3 F (36.8 C)  SpO2: 96%   Height: 5' 1.5" (156.2 cm) Weight: 194 lb 12.8 oz (88.4 kg)  Physical Exam Constitutional:      Appearance: She is not diaphoretic.     Comments: Throat clearing, coughing  HENT:     Head: Normocephalic. No right periorbital erythema or left periorbital erythema.     Right Ear: Tympanic membrane, ear  canal and external ear normal.     Left Ear: Tympanic membrane, ear canal and external ear normal.     Nose: Nose normal. No mucosal edema or rhinorrhea.     Mouth/Throat:     Pharynx: Uvula midline. No oropharyngeal exudate.  Eyes:     General: Lids are normal.     Conjunctiva/sclera: Conjunctivae normal.     Pupils: Pupils are equal, round, and reactive to light.  Neck:     Thyroid: No thyromegaly.     Trachea: Trachea normal. No tracheal tenderness or tracheal deviation.  Cardiovascular:     Rate and Rhythm: Normal rate and regular rhythm.     Heart sounds: Normal heart sounds, S1 normal and S2 normal. No murmur.  Pulmonary:     Effort: Pulmonary effort is normal. No respiratory distress.     Breath sounds: Normal breath sounds. No stridor. No wheezing or rales.  Chest:     Chest wall: No tenderness.  Abdominal:     General: There is no distension.     Palpations: Abdomen is soft. There is no mass.     Tenderness: There is no abdominal tenderness. There is no guarding or rebound.  Musculoskeletal:        General: No tenderness.  Lymphadenopathy:     Head:     Right side of head: No tonsillar adenopathy.     Left side of head: No tonsillar adenopathy.     Cervical: No cervical adenopathy.  Skin:    Coloration: Skin is not pale.     Findings: No erythema or rash.     Nails: There is no clubbing.   Neurological:     Mental Status: She is alert.     Diagnostics: Allergy skin tests were performed.  She demonstrated hypersensitivity to weeds.  Spirometry was performed and demonstrated an FEV1 of  1.87 @ 88 % of predicted. FEV1/FVC = 0.89.   Assessment and Plan:    1. Not well controlled mild persistent asthma   2. Perennial allergic rhinitis   3. LPRD (laryngopharyngeal reflux disease)     1.  Allergen avoidance measures?  2.  Treat and prevent inflammation:   A.  OTC Nasacort 1 spray each nostril 1 time per day  B.  Asmanex 220 Twisthaler 1 inhalation 1 time per day  3.  Treat and prevent reflux:   A.  Consolidate caffeine and chocolate consumption  B.  Pantoprazole 40 mg tablet 2 times per day  C.  Famotidine 40 mg tablet in p.m.  4.  If needed:   A.  Albuterol HFA 2 inhalations every 4-6 hours  5.  Evaluation of throat with ENT  6.  Return to clinic in 4 weeks or earlier if problem  [place] appears to have an inflamed and irritated respiratory track.  I will assume that some of this inflammation is eosinophilic driven and have her utilize anti-inflammatory agents for both her upper and lower airway.  She does have a presentation that suggests that reflux may also be responsible for this issue and we will treat her aggressively for that condition as noted above.  To be complete I would like to have her laryngeal structures evaluated by ENT to make sure were not dealing with some other issue contributing to her symptoms.  I will see her back in this clinic in 4 weeks or earlier if there is a problem.  Jiles Prows, MD Allergy / Immunology [place]  [facility] [place]

## 2018-04-06 NOTE — Patient Instructions (Addendum)
  1.  Allergen avoidance measures?  2.  Treat and prevent inflammation:   A.  OTC Nasacort 1 spray each nostril 1 time per day  B.  Asmanex 220 Twisthaler 1 inhalation 1 time per day  3.  Treat and prevent reflux:   A.  Consolidate caffeine and chocolate consumption  B.  Pantoprazole 40 mg tablet 2 times per day  C.  Famotidine 40 mg tablet in p.m.  4.  If needed:   A.  Albuterol HFA 2 inhalations every 4-6 hours  5.  Evaluation of throat with ENT  6.  Return to clinic in 4 weeks or earlier if problem

## 2018-04-07 ENCOUNTER — Telehealth: Payer: Self-pay

## 2018-04-07 ENCOUNTER — Encounter: Payer: Self-pay | Admitting: Allergy and Immunology

## 2018-04-07 DIAGNOSIS — N390 Urinary tract infection, site not specified: Secondary | ICD-10-CM | POA: Diagnosis not present

## 2018-04-07 DIAGNOSIS — R300 Dysuria: Secondary | ICD-10-CM | POA: Diagnosis not present

## 2018-04-07 DIAGNOSIS — N898 Other specified noninflammatory disorders of vagina: Secondary | ICD-10-CM | POA: Diagnosis not present

## 2018-04-07 NOTE — Telephone Encounter (Signed)
Faxed referral information to [facility] for diagnosis of LPR.  Patient will contact ENT to schedule appointment.

## 2018-04-13 DIAGNOSIS — R6883 Chills (without fever): Secondary | ICD-10-CM | POA: Diagnosis not present

## 2018-04-13 DIAGNOSIS — R1110 Vomiting, unspecified: Secondary | ICD-10-CM | POA: Diagnosis not present

## 2018-04-13 DIAGNOSIS — R52 Pain, unspecified: Secondary | ICD-10-CM | POA: Diagnosis not present

## 2018-04-14 LAB — CUP PACEART INCLINIC DEVICE CHECK
Battery Remaining Longevity: 107 mo
Battery Voltage: 3.03 V
Brady Statistic AP VP Percent: 0 %
Brady Statistic AP VS Percent: 44.9 %
Brady Statistic AS VP Percent: 0 %
Brady Statistic AS VS Percent: 55.1 %
Brady Statistic RA Percent Paced: 44.59 %
Brady Statistic RV Percent Paced: 0 %
Date Time Interrogation Session: 20191209155126
HighPow Impedance: 46 Ohm
HighPow Impedance: 56 Ohm
Implantable Lead Implant Date: 20111212
Implantable Lead Implant Date: 20111212
Implantable Lead Location: 753859
Implantable Lead Location: 753860
Implantable Lead Model: 5076
Implantable Lead Model: 6947
Implantable Pulse Generator Implant Date: 20190826
Lead Channel Impedance Value: 304 Ohm
Lead Channel Impedance Value: 361 Ohm
Lead Channel Impedance Value: 399 Ohm
Lead Channel Pacing Threshold Amplitude: 1.625 V
Lead Channel Pacing Threshold Pulse Width: 0.4 ms
Lead Channel Pacing Threshold Pulse Width: 1 ms
Lead Channel Sensing Intrinsic Amplitude: 0.875 mV
Lead Channel Sensing Intrinsic Amplitude: 0.875 mV
Lead Channel Sensing Intrinsic Amplitude: 10.125 mV
Lead Channel Sensing Intrinsic Amplitude: 6.625 mV
Lead Channel Setting Pacing Amplitude: 3.25 V
Lead Channel Setting Pacing Amplitude: 5 V
Lead Channel Setting Pacing Pulse Width: 1 ms
Lead Channel Setting Sensing Sensitivity: 0.3 mV
MDC IDC MSMT LEADCHNL RV PACING THRESHOLD AMPLITUDE: 3 V

## 2018-04-18 DIAGNOSIS — E875 Hyperkalemia: Secondary | ICD-10-CM | POA: Diagnosis not present

## 2018-04-25 ENCOUNTER — Ambulatory Visit (INDEPENDENT_AMBULATORY_CARE_PROVIDER_SITE_OTHER): Payer: Medicare Other | Admitting: *Deleted

## 2018-04-25 DIAGNOSIS — I255 Ischemic cardiomyopathy: Secondary | ICD-10-CM

## 2018-04-25 DIAGNOSIS — I5022 Chronic systolic (congestive) heart failure: Secondary | ICD-10-CM

## 2018-04-26 LAB — CUP PACEART REMOTE DEVICE CHECK
Battery Remaining Longevity: 102 mo
Battery Voltage: 3.01 V
Brady Statistic AP VP Percent: 0 %
Brady Statistic AP VS Percent: 42.96 %
Brady Statistic AS VP Percent: 0 %
Brady Statistic AS VS Percent: 57.04 %
Brady Statistic RA Percent Paced: 42.85 %
Brady Statistic RV Percent Paced: 0 %
Date Time Interrogation Session: 20200309062702
HIGH POWER IMPEDANCE MEASURED VALUE: 60 Ohm
HighPow Impedance: 47 Ohm
Implantable Lead Implant Date: 20111212
Implantable Lead Implant Date: 20111212
Implantable Lead Location: 753859
Implantable Lead Location: 753860
Implantable Lead Model: 5076
Implantable Lead Model: 6947
Implantable Pulse Generator Implant Date: 20190826
Lead Channel Impedance Value: 342 Ohm
Lead Channel Impedance Value: 342 Ohm
Lead Channel Impedance Value: 418 Ohm
Lead Channel Pacing Threshold Amplitude: 1.5 V
Lead Channel Pacing Threshold Pulse Width: 0.4 ms
Lead Channel Sensing Intrinsic Amplitude: 0.75 mV
Lead Channel Sensing Intrinsic Amplitude: 0.75 mV
Lead Channel Sensing Intrinsic Amplitude: 6.875 mV
Lead Channel Sensing Intrinsic Amplitude: 6.875 mV
Lead Channel Setting Pacing Amplitude: 3.25 V
Lead Channel Setting Pacing Amplitude: 5 V
Lead Channel Setting Pacing Pulse Width: 1 ms
Lead Channel Setting Sensing Sensitivity: 0.3 mV

## 2018-05-03 NOTE — Progress Notes (Signed)
Remote ICD transmission.   

## 2018-05-04 ENCOUNTER — Ambulatory Visit: Payer: Medicare Other | Admitting: Allergy and Immunology

## 2018-05-13 DIAGNOSIS — D509 Iron deficiency anemia, unspecified: Secondary | ICD-10-CM | POA: Diagnosis not present

## 2018-05-13 DIAGNOSIS — E1169 Type 2 diabetes mellitus with other specified complication: Secondary | ICD-10-CM | POA: Diagnosis not present

## 2018-05-13 DIAGNOSIS — E785 Hyperlipidemia, unspecified: Secondary | ICD-10-CM | POA: Diagnosis not present

## 2018-05-13 DIAGNOSIS — R05 Cough: Secondary | ICD-10-CM | POA: Diagnosis not present

## 2018-05-13 DIAGNOSIS — Z6835 Body mass index (BMI) 35.0-35.9, adult: Secondary | ICD-10-CM | POA: Diagnosis not present

## 2018-05-13 DIAGNOSIS — I10 Essential (primary) hypertension: Secondary | ICD-10-CM | POA: Diagnosis not present

## 2018-05-13 DIAGNOSIS — Z79899 Other long term (current) drug therapy: Secondary | ICD-10-CM | POA: Diagnosis not present

## 2018-05-13 DIAGNOSIS — E039 Hypothyroidism, unspecified: Secondary | ICD-10-CM | POA: Diagnosis not present

## 2018-05-26 ENCOUNTER — Other Ambulatory Visit: Payer: Self-pay

## 2018-05-26 MED ORDER — APIXABAN 5 MG PO TABS: 5.0000 mg | ORAL_TABLET | Freq: Two times a day (BID) | ORAL | 2 refills | Status: DC

## 2018-07-14 DIAGNOSIS — L300 Nummular dermatitis: Secondary | ICD-10-CM | POA: Diagnosis not present

## 2018-07-14 DIAGNOSIS — L299 Pruritus, unspecified: Secondary | ICD-10-CM | POA: Diagnosis not present

## 2018-07-25 ENCOUNTER — Ambulatory Visit (INDEPENDENT_AMBULATORY_CARE_PROVIDER_SITE_OTHER): Payer: Medicare Other | Admitting: *Deleted

## 2018-07-25 ENCOUNTER — Other Ambulatory Visit: Payer: Self-pay

## 2018-07-25 ENCOUNTER — Telehealth: Payer: Medicare Other | Admitting: Cardiology

## 2018-07-25 DIAGNOSIS — I429 Cardiomyopathy, unspecified: Secondary | ICD-10-CM

## 2018-07-25 DIAGNOSIS — I5022 Chronic systolic (congestive) heart failure: Secondary | ICD-10-CM

## 2018-07-25 LAB — CUP PACEART REMOTE DEVICE CHECK
Battery Remaining Longevity: 105 mo
Battery Voltage: 2.99 V
Brady Statistic AP VP Percent: 0.01 %
Brady Statistic AP VS Percent: 31.59 %
Brady Statistic AS VP Percent: 0 %
Brady Statistic AS VS Percent: 68.4 %
Brady Statistic RA Percent Paced: 31.54 %
Brady Statistic RV Percent Paced: 0.01 %
Date Time Interrogation Session: 20200608052503
HighPow Impedance: 47 Ohm
HighPow Impedance: 61 Ohm
Implantable Lead Implant Date: 20111212
Implantable Lead Implant Date: 20111212
Implantable Lead Location: 753859
Implantable Lead Location: 753860
Implantable Lead Model: 5076
Implantable Lead Model: 6947
Implantable Pulse Generator Implant Date: 20190826
Lead Channel Impedance Value: 342 Ohm
Lead Channel Impedance Value: 361 Ohm
Lead Channel Impedance Value: 418 Ohm
Lead Channel Pacing Threshold Amplitude: 1.375 V
Lead Channel Pacing Threshold Pulse Width: 0.4 ms
Lead Channel Sensing Intrinsic Amplitude: 0.875 mV
Lead Channel Sensing Intrinsic Amplitude: 0.875 mV
Lead Channel Sensing Intrinsic Amplitude: 6.875 mV
Lead Channel Sensing Intrinsic Amplitude: 6.875 mV
Lead Channel Setting Pacing Amplitude: 2.75 V
Lead Channel Setting Pacing Amplitude: 5 V
Lead Channel Setting Pacing Pulse Width: 1 ms
Lead Channel Setting Sensing Sensitivity: 0.3 mV

## 2018-08-01 ENCOUNTER — Telehealth: Payer: Self-pay | Admitting: *Deleted

## 2018-08-01 NOTE — Telephone Encounter (Signed)
lmtcb

## 2018-08-01 NOTE — Telephone Encounter (Signed)
-----   Message from Will Meredith Leeds, MD sent at 07/26/2018  9:48 AM EDT ----- Abnormal device interrogation reviewed.  Lead parameters and battery status stable.  nsvt noted. Increase coreg to 25 mg.

## 2018-08-02 NOTE — Progress Notes (Signed)
Remote ICD transmission.   

## 2018-08-09 ENCOUNTER — Telehealth: Payer: Self-pay | Admitting: Cardiology

## 2018-08-09 NOTE — Telephone Encounter (Signed)
New Message  Patient is calling in for the results from the readings from the [place] [facility] . Please Advise.

## 2018-08-12 NOTE — Telephone Encounter (Signed)
Spoke with pt and given update on last manual transmission. One 4 beat episode of NSVT and no other episodes. Pt has no questions or concerns.

## 2018-08-16 NOTE — Telephone Encounter (Signed)
lmtcb

## 2018-08-23 NOTE — Telephone Encounter (Signed)
Follow up ° ° °Patient is returning your call. Please call. ° ° ° °

## 2018-08-26 NOTE — Telephone Encounter (Signed)
Left detailed message on voicemail (DPR on file) informing pt of recommendation. Advised to increase over weekend and call me next week to report how doing on increased dose and to discuss refills.  Advised if she would prefer to wait to increase until we speak, that would be fine. Will follow up verbally w/ pt by next week.

## 2018-08-29 NOTE — Telephone Encounter (Signed)
Follow up ° ° °Patient is returning your call. Please call. ° ° ° °

## 2018-08-29 NOTE — Telephone Encounter (Signed)
Pt reports that she received my message.  She did take increased dose this morning. Pt inquired as to specifics to change - what kind of event SVT vs VT, how long did it last, how many occurrences.  She also reports hesitancy in increasing Carvedilol - states that she became hypotensive at higher dose. Report reviewed by device clinic, Chanetta Marshall, NP, for clarification of requested information.  Pt informed: [place] recommendation: Just one episode, it was a very short run of SVT (5/22 at 5:39PM). No other episodes. If she's not symptomatic and worried about hypotension, I would leave Coreg like it is for now. She does need an in clinic appt next time ya'll go - we need to reprogram RV outputs.  Pt informed of recommendation and voices no symptoms.  Will remain on Carvedilol 12.5 mg BID. Dr. Curt Bears made aware and agreeable to plan.

## 2018-09-16 DIAGNOSIS — E2839 Other primary ovarian failure: Secondary | ICD-10-CM | POA: Diagnosis not present

## 2018-09-16 DIAGNOSIS — E785 Hyperlipidemia, unspecified: Secondary | ICD-10-CM | POA: Diagnosis not present

## 2018-09-16 DIAGNOSIS — E039 Hypothyroidism, unspecified: Secondary | ICD-10-CM | POA: Diagnosis not present

## 2018-09-16 DIAGNOSIS — Z0000 Encounter for general adult medical examination without abnormal findings: Secondary | ICD-10-CM | POA: Diagnosis not present

## 2018-09-16 DIAGNOSIS — Z23 Encounter for immunization: Secondary | ICD-10-CM | POA: Diagnosis not present

## 2018-09-16 DIAGNOSIS — E1169 Type 2 diabetes mellitus with other specified complication: Secondary | ICD-10-CM | POA: Diagnosis not present

## 2018-09-16 DIAGNOSIS — Z7189 Other specified counseling: Secondary | ICD-10-CM | POA: Diagnosis not present

## 2018-09-16 DIAGNOSIS — Z6834 Body mass index (BMI) 34.0-34.9, adult: Secondary | ICD-10-CM | POA: Diagnosis not present

## 2018-09-16 DIAGNOSIS — Z1339 Encounter for screening examination for other mental health and behavioral disorders: Secondary | ICD-10-CM | POA: Diagnosis not present

## 2018-09-16 DIAGNOSIS — I10 Essential (primary) hypertension: Secondary | ICD-10-CM | POA: Diagnosis not present

## 2018-09-16 DIAGNOSIS — R05 Cough: Secondary | ICD-10-CM | POA: Diagnosis not present

## 2018-09-16 DIAGNOSIS — Z1331 Encounter for screening for depression: Secondary | ICD-10-CM | POA: Diagnosis not present

## 2018-09-21 DIAGNOSIS — E039 Hypothyroidism, unspecified: Secondary | ICD-10-CM | POA: Diagnosis not present

## 2018-09-21 DIAGNOSIS — Z1159 Encounter for screening for other viral diseases: Secondary | ICD-10-CM | POA: Diagnosis not present

## 2018-09-21 DIAGNOSIS — D509 Iron deficiency anemia, unspecified: Secondary | ICD-10-CM | POA: Diagnosis not present

## 2018-09-21 DIAGNOSIS — E1165 Type 2 diabetes mellitus with hyperglycemia: Secondary | ICD-10-CM | POA: Diagnosis not present

## 2018-09-21 DIAGNOSIS — E785 Hyperlipidemia, unspecified: Secondary | ICD-10-CM | POA: Diagnosis not present

## 2018-09-21 DIAGNOSIS — I10 Essential (primary) hypertension: Secondary | ICD-10-CM | POA: Diagnosis not present

## 2018-09-21 DIAGNOSIS — Z79899 Other long term (current) drug therapy: Secondary | ICD-10-CM | POA: Diagnosis not present

## 2018-09-26 DIAGNOSIS — Z1211 Encounter for screening for malignant neoplasm of colon: Secondary | ICD-10-CM | POA: Diagnosis not present

## 2018-09-26 DIAGNOSIS — Z1212 Encounter for screening for malignant neoplasm of rectum: Secondary | ICD-10-CM | POA: Diagnosis not present

## 2018-10-17 ENCOUNTER — Encounter: Payer: Medicare Other | Admitting: Cardiology

## 2018-10-17 NOTE — Progress Notes (Deleted)
Electrophysiology Office Note   Date:  10/17/2018   ID:  Christina Perry, DOB December 27, 1950, MRN [id number]  PCP:  Ernestene Kiel, MD  Cardiologist:  Agustin Cree Primary Electrophysiologist:  Constance Haw, MD    No chief complaint on file.    History of Present Illness: Christina Perry is a 68 y.o. female who is being seen today for the evaluation of ischemic cardiomyopathy at the request of Ernestene Kiel, MD. Presenting today for electrophysiology evaluation.  She has a history of an ischemic cardiomyopathy with a myocardial infarction at the age of 28, type 2 diabetes, hypertension, and hyperlipidemia.  She presents for evaluation of her ICD.  Today, denies symptoms of palpitations, chest pain, shortness of breath, orthopnea, PND, lower extremity edema, claudication, dizziness, presyncope, syncope, bleeding, or neurologic sequela. The patient is tolerating medications without difficulties. ***    Past Medical History:  Diagnosis Date  . Anxiety   . Cardiomyopathy ([place])   . Coronary artery stenosis   . Depression   . Diabetes mellitus without complication ([place])   . Hyperlipidemia   . Hypertension   . MI (myocardial infarction) ([place])   . Thyroid disease    Past Surgical History:  Procedure Laterality Date  . [place] SURGERY  2005  . CAROTID STENT    . CESAREAN SECTION  1979  . CORONARY ANGIOPLASTY    . ICD GENERATOR CHANGEOUT N/A 10/11/2017   Procedure: ICD GENERATOR CHANGEOUT;  Surgeon: Constance Haw, MD;  Location: [facility];  Service: Cardiovascular;  Laterality: N/A;  . ICD Placement    . TONSILLECTOMY       Current Outpatient Medications  Medication Sig Dispense Refill  . albuterol (VENTOLIN HFA) 108 (90 Base) MCG/ACT inhaler Can inhale two puffs every four to six hours as needed for cough or wheeze. 1 Inhaler 1  . apixaban (ELIQUIS) 5 MG TABS tablet Take 1 tablet (5 mg total) by mouth 2 (two) times daily. 180 tablet 2  .  atorvastatin (LIPITOR) 20 MG tablet Take 20 mg by mouth daily.    . carvedilol (COREG) 12.5 MG tablet Take 12.5 mg by mouth 2 (two) times daily.     . famotidine (PEPCID) 40 MG tablet Take 1 tablet (40 mg total) by mouth at bedtime. 30 tablet 5  . glimepiride (AMARYL) 4 MG tablet Take 4 mg by mouth daily.    Marland Kitchen levothyroxine (SYNTHROID, LEVOTHROID) 175 MCG tablet TAKE 1 TABLET BY MOUTH ONCE DAILY ON AN EMPTY STOMACH IN THE MORNING  0  . Magnesium 500 MG TABS Take 1,000 mg by mouth 2 (two) times daily.     . metFORMIN (GLUCOPHAGE) 1000 MG tablet Take 1,000 mg by mouth 2 (two) times daily.    . pantoprazole (PROTONIX) 40 MG tablet Take 1 tablet (40 mg total) by mouth 2 (two) times daily. 60 tablet 5  . PARoxetine (PAXIL) 10 MG tablet Take 10 mg by mouth daily.     No current facility-administered medications for this visit.     Allergies:   Cozaar [losartan] and Lisinopril   Social History:  The patient  reports that she has never smoked. She has never used smokeless tobacco. She reports current alcohol use. She reports that she does not use drugs.   Family History:  The patient's family history includes CAD in her mother; COPD in her mother; Diabetes in her maternal grandmother and mother; Stroke in her maternal grandmother and mother.    ROS:  Please see  the history of present illness.   Otherwise, review of systems is positive for ***.   All other systems are reviewed and negative.   PHYSICAL EXAM: VS:  There were no vitals taken for this visit. , BMI There is no height or weight on file to calculate BMI. GEN: Well nourished, well developed, in no acute distress  HEENT: normal  Neck: no JVD, carotid bruits, or masses Cardiac: ***RRR; no murmurs, rubs, or gallops,no edema  Respiratory:  clear to auscultation bilaterally, normal work of breathing GI: soft, nontender, nondistended, + BS MS: no deformity or atrophy  Skin: warm and dry, ***device site well healed Neuro:  Strength and  sensation are intact Psych: euthymic mood, full affect  EKG:  EKG {ACTION; IS/IS [id number] ordered today. Personal review of the ekg ordered *** shows ***  ***Personal review of the device interrogation today. Results in [place]: No results found for requested labs within last 8760 hours.    Lipid Panel  No results found for: CHOL, TRIG, HDL, CHOLHDL, VLDL, LDLCALC, LDLDIRECT   Wt Readings from Last 3 Encounters:  04/06/18 194 lb 12.8 oz (88.4 kg)  01/24/18 198 lb (89.8 kg)  10/11/17 205 lb (93 kg)      Other studies Reviewed: Additional studies/ records that were reviewed today include: TTE 05/03/17 Left atrial enlargement Mild LV systolic dysfunction EF [id number] with mid distal septal akinesis, anterior hypokinesis, apical akinesis Normal right heart structures Trace mitral regurgitation  ASSESSMENT AND PLAN:  1.  Systolic heart failure chronic due to ischemic cardiomyopathy: ICD implanted with generator change 10/11/17. On optimal medical therapy. Device functioning appropriately. No changes.***  2.  Hypertension: ***  3.  Hyperlipidemia: continue lipitor  4.  Coronary artery disease: no current chest pain. Continue current management  5.  LV thrombus: continue eliquis  Current medicines are reviewed at length with the patient today.   The patient does not have concerns regarding her medicines.  The following changes were made today: ***  Labs/ tests ordered today include:  No orders of the defined types were placed in this encounter.    Disposition:   FU with Jen Eppinger *** months  Signed, Jaquavian Firkus Meredith Leeds, MD  10/17/2018 3:09 PM     [address] [place] [address] [phone] (office) [phone] (fax)

## 2018-10-25 ENCOUNTER — Ambulatory Visit (INDEPENDENT_AMBULATORY_CARE_PROVIDER_SITE_OTHER): Payer: Medicare Other | Admitting: *Deleted

## 2018-10-25 DIAGNOSIS — I429 Cardiomyopathy, unspecified: Secondary | ICD-10-CM | POA: Diagnosis not present

## 2018-10-25 LAB — CUP PACEART REMOTE DEVICE CHECK
Battery Remaining Longevity: 100 mo
Battery Voltage: 2.99 V
Brady Statistic AP VP Percent: 0.01 %
Brady Statistic AP VS Percent: 34.45 %
Brady Statistic AS VP Percent: 0 %
Brady Statistic AS VS Percent: 65.55 %
Brady Statistic RA Percent Paced: 34.3 %
Brady Statistic RV Percent Paced: 0.01 %
Date Time Interrogation Session: 20200908062603
HighPow Impedance: 47 Ohm
HighPow Impedance: 57 Ohm
Implantable Lead Implant Date: 20111212
Implantable Lead Implant Date: 20111212
Implantable Lead Location: 753859
Implantable Lead Location: 753860
Implantable Lead Model: 5076
Implantable Lead Model: 6947
Implantable Pulse Generator Implant Date: 20190826
Lead Channel Impedance Value: 304 Ohm
Lead Channel Impedance Value: 399 Ohm
Lead Channel Impedance Value: 418 Ohm
Lead Channel Pacing Threshold Amplitude: 1.625 V
Lead Channel Pacing Threshold Pulse Width: 0.4 ms
Lead Channel Sensing Intrinsic Amplitude: 0.875 mV
Lead Channel Sensing Intrinsic Amplitude: 0.875 mV
Lead Channel Sensing Intrinsic Amplitude: 6.125 mV
Lead Channel Sensing Intrinsic Amplitude: 6.125 mV
Lead Channel Setting Pacing Amplitude: 3.5 V
Lead Channel Setting Pacing Amplitude: 5 V
Lead Channel Setting Pacing Pulse Width: 1 ms
Lead Channel Setting Sensing Sensitivity: 0.3 mV

## 2018-10-28 DIAGNOSIS — Z23 Encounter for immunization: Secondary | ICD-10-CM | POA: Diagnosis not present

## 2018-11-09 ENCOUNTER — Encounter: Payer: Self-pay | Admitting: Cardiology

## 2018-11-09 NOTE — Progress Notes (Signed)
Remote ICD transmission.   

## 2018-11-14 ENCOUNTER — Other Ambulatory Visit: Payer: Self-pay

## 2018-11-14 ENCOUNTER — Encounter: Payer: Self-pay | Admitting: Cardiology

## 2018-11-14 ENCOUNTER — Ambulatory Visit (INDEPENDENT_AMBULATORY_CARE_PROVIDER_SITE_OTHER): Payer: Medicare Other | Admitting: Cardiology

## 2018-11-14 VITALS — BP 108/58 | HR 69 | Ht 61.5 in | Wt 184.8 lb

## 2018-11-14 DIAGNOSIS — Z95810 Presence of automatic (implantable) cardiac defibrillator: Secondary | ICD-10-CM | POA: Diagnosis not present

## 2018-11-14 DIAGNOSIS — I255 Ischemic cardiomyopathy: Secondary | ICD-10-CM

## 2018-11-14 LAB — CUP PACEART INCLINIC DEVICE CHECK
Battery Remaining Longevity: 95 mo
Battery Voltage: 3 V
Brady Statistic AP VP Percent: 0.01 %
Brady Statistic AP VS Percent: 36.2 %
Brady Statistic AS VP Percent: 0 %
Brady Statistic AS VS Percent: 63.79 %
Brady Statistic RA Percent Paced: 36.1 %
Brady Statistic RV Percent Paced: 0.01 %
Date Time Interrogation Session: 20200928164246
HighPow Impedance: 54 Ohm
HighPow Impedance: 54 Ohm
HighPow Impedance: 70 Ohm
HighPow Impedance: 70 Ohm
Implantable Lead Implant Date: 20111212
Implantable Lead Implant Date: 20111212
Implantable Lead Location: 753859
Implantable Lead Location: 753860
Implantable Lead Model: 5076
Implantable Lead Model: 6947
Implantable Pulse Generator Implant Date: 20190826
Lead Channel Impedance Value: 304 Ohm
Lead Channel Impedance Value: 304 Ohm
Lead Channel Impedance Value: 399 Ohm
Lead Channel Impedance Value: 399 Ohm
Lead Channel Impedance Value: 418 Ohm
Lead Channel Impedance Value: 418 Ohm
Lead Channel Pacing Threshold Amplitude: 1.5 V
Lead Channel Pacing Threshold Amplitude: 3.25 V
Lead Channel Pacing Threshold Pulse Width: 0.8 ms
Lead Channel Pacing Threshold Pulse Width: 1 ms
Lead Channel Sensing Intrinsic Amplitude: 0.75 mV
Lead Channel Sensing Intrinsic Amplitude: 11.375 mV
Lead Channel Setting Pacing Amplitude: 2.5 V
Lead Channel Setting Pacing Amplitude: 5 V
Lead Channel Setting Pacing Pulse Width: 1 ms
Lead Channel Setting Sensing Sensitivity: 0.3 mV

## 2018-11-14 NOTE — Progress Notes (Signed)
Electrophysiology Office Note   Date:  11/14/2018   ID:  Christina Perry, DOB 03-07-1950, MRN [id number]  PCP:  Ernestene Kiel, MD  Cardiologist:  Agustin Cree Primary Electrophysiologist:  Constance Haw, MD    No chief complaint on file.    History of Present Illness: Christina Perry is a 68 y.o. female who is being seen today for the evaluation of ischemic cardiomyopathy at the request of Ernestene Kiel, MD. Presenting today for electrophysiology evaluation.  She has a history of an ischemic cardiomyopathy with a myocardial infarction at the age of 72, type 2 diabetes, hypertension, and hyperlipidemia.  She presents for evaluation of her ICD.  Today, denies symptoms of palpitations, chest pain, shortness of breath, orthopnea, PND, lower extremity edema, claudication, dizziness, presyncope, syncope, bleeding, or neurologic sequela. The patient is tolerating medications without difficulties.     Past Medical History:  Diagnosis Date  . Anxiety   . Cardiomyopathy ([place])   . Coronary artery stenosis   . Depression   . Diabetes mellitus without complication ([place])   . Hyperlipidemia   . Hypertension   . MI (myocardial infarction) ([place])   . Thyroid disease    Past Surgical History:  Procedure Laterality Date  . [place] SURGERY  2005  . CAROTID STENT    . CESAREAN SECTION  1979  . CORONARY ANGIOPLASTY    . ICD GENERATOR CHANGEOUT N/A 10/11/2017   Procedure: ICD GENERATOR CHANGEOUT;  Surgeon: Constance Haw, MD;  Location: [facility];  Service: Cardiovascular;  Laterality: N/A;  . ICD Placement    . TONSILLECTOMY       Current Outpatient Medications  Medication Sig Dispense Refill  . albuterol (VENTOLIN HFA) 108 (90 Base) MCG/ACT inhaler Can inhale two puffs every four to six hours as needed for cough or wheeze. 1 Inhaler 1  . apixaban (ELIQUIS) 5 MG TABS tablet Take 1 tablet (5 mg total) by mouth 2 (two) times daily. 180 tablet 2  . atorvastatin  (LIPITOR) 20 MG tablet Take 20 mg by mouth daily.    . carvedilol (COREG) 12.5 MG tablet Take 12.5 mg by mouth 2 (two) times daily.     Marland Kitchen glimepiride (AMARYL) 4 MG tablet Take 4 mg by mouth daily.    Marland Kitchen levothyroxine (SYNTHROID, LEVOTHROID) 175 MCG tablet TAKE 1 TABLET BY MOUTH ONCE DAILY ON AN EMPTY STOMACH IN THE MORNING  0  . Magnesium 500 MG TABS Take 1,000 mg by mouth 2 (two) times daily.     . metFORMIN (GLUCOPHAGE) 1000 MG tablet Take 1,000 mg by mouth 2 (two) times daily.    . pantoprazole (PROTONIX) 40 MG tablet Take 1 tablet (40 mg total) by mouth 2 (two) times daily. 60 tablet 5  . PARoxetine (PAXIL) 10 MG tablet Take 10 mg by mouth daily.     No current facility-administered medications for this visit.     Allergies:   Cozaar [losartan] and Lisinopril   Social History:  The patient  reports that she has never smoked. She has never used smokeless tobacco. She reports current alcohol use. She reports that she does not use drugs.   Family History:  The patient's family history includes CAD in her mother; COPD in her mother; Diabetes in her maternal grandmother and mother; Stroke in her maternal grandmother and mother.    ROS:  Please see the history of present illness.   Otherwise, review of systems is positive for none.   All other systems  are reviewed and negative.   PHYSICAL EXAM: VS:  BP (!) 108/58   Pulse 69   Ht 5' 1.5" (1.562 m)   Wt 184 lb 12.8 oz (83.8 kg)   BMI 34.35 kg/m  , BMI Body mass index is 34.35 kg/m. GEN: Well nourished, well developed, in no acute distress  HEENT: normal  Neck: no JVD, carotid bruits, or masses Cardiac: RRR; no murmurs, rubs, or gallops,no edema  Respiratory:  clear to auscultation bilaterally, normal work of breathing GI: soft, nontender, nondistended, + BS MS: no deformity or atrophy  Skin: warm and dry, device site well healed Neuro:  Strength and sensation are intact Psych: euthymic mood, full affect  EKG:  EKG is ordered today.  Personal review of the ekg ordered shows sinus rhythm, left axis deviation, anterior infarct, rate 69  Personal review of the device interrogation today. Results in [place]: No results found for requested labs within last 8760 hours.    Lipid Panel  No results found for: CHOL, TRIG, HDL, CHOLHDL, VLDL, LDLCALC, LDLDIRECT   Wt Readings from Last 3 Encounters:  11/14/18 184 lb 12.8 oz (83.8 kg)  04/06/18 194 lb 12.8 oz (88.4 kg)  01/24/18 198 lb (89.8 kg)      Other studies Reviewed: Additional studies/ records that were reviewed today include: TTE 05/03/17 Left atrial enlargement Mild LV systolic dysfunction EF [id number] with mid distal septal akinesis, anterior hypokinesis, apical akinesis Normal right heart structures Trace mitral regurgitation  ASSESSMENT AND PLAN:  1.  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  Currently on optimal medical therapy.  She does have an elevated RV and RA threshold, though otherwise the device is functioning appropriately.  No changes.    2.  Hypertension: Currently well controlled  3.  Hyperlipidemia: Continue Lipitor  4.  Coronary artery disease: No current chest pain.  Plan per primary cardiology.  5.  LV thrombus: Continue Eliquis per primary cardiology.  Current medicines are reviewed at length with the patient today.   The patient does not have concerns regarding her medicines.  The following changes were made today: None  Labs/ tests ordered today include:  Orders Placed This Encounter  Procedures  . EKG 12-Lead     Disposition:   FU with Will Camnitz 12 thank very much months  Signed, Will Meredith Leeds, MD  11/14/2018 4:05 PM     [address] [place] [address] [phone] (office) [phone] (fax)

## 2018-11-14 NOTE — Patient Instructions (Signed)
Medication Instructions:  Your physician recommends that you continue on your current medications as directed. Please refer to the Current Medication list given to you today.  *If you need a refill on your cardiac medications before your next appointment, please call your pharmacy*  Labwork: None ordered  Testing/Procedures: None ordered  Follow-Up: Remote monitoring is used to monitor your Pacemaker or ICD from home. This monitoring reduces the number of office visits required to check your device to one time per year. It allows [place] to keep an eye on the functioning of your device to ensure it is working properly. You are scheduled for a device check from home on 01/24/19. You may send your transmission at any time that day. If you have a wireless device, the transmission will be sent automatically. After your physician reviews your transmission, you will receive a postcard with your next transmission date.  Your physician wants you to follow-up in: 6 months with Dr. Agustin Cree. You will receive a reminder letter in the mail two months in advance. If you don't receive a letter, please call our office to schedule the follow-up appointment.  Your physician wants you to follow-up in: 1 year with Dr. Curt Bears.  You will receive a reminder letter in the mail two months in advance. If you don't receive a letter, please call our office to schedule the follow-up appointment.  Thank you for choosing [facility]!!   Trinidad Curet, RN [phone]  Any Other Special Instructions Will Be Listed Below (If Applicable).

## 2018-12-01 ENCOUNTER — Other Ambulatory Visit: Payer: Self-pay | Admitting: Cardiology

## 2018-12-01 NOTE — Telephone Encounter (Signed)
This is a [place] pt °

## 2018-12-02 NOTE — Telephone Encounter (Signed)
Rx refill sent to pharmacy. Sent in #60 and put note that pt needs office visit with cardiologist before more refills.

## 2018-12-05 DIAGNOSIS — E039 Hypothyroidism, unspecified: Secondary | ICD-10-CM | POA: Diagnosis not present

## 2019-01-04 ENCOUNTER — Other Ambulatory Visit: Payer: Self-pay | Admitting: Cardiology

## 2019-01-04 ENCOUNTER — Telehealth: Payer: Self-pay | Admitting: Cardiology

## 2019-01-04 NOTE — Telephone Encounter (Signed)
Left message for patient to return call. She has not seen Dr. Agustin Cree in over 1 year.

## 2019-01-04 NOTE — Telephone Encounter (Signed)
Medication Refill- Eloquis 5 mg BID

## 2019-01-06 MED ORDER — APIXABAN 5 MG PO TABS: 5.0000 mg | ORAL_TABLET | Freq: Two times a day (BID) | ORAL | 0 refills | Status: DC

## 2019-01-06 NOTE — Telephone Encounter (Signed)
Patient came to office upset that eliquis refills were sent to wrong pharmacy (in [place]) by someone and that pharmacy would not transfer rx back to local pharmacy in [place].  Also upset that only 30 day refill sent. She had rx sent to [place] pharmacy when on vacation there last time.    Informed that pt has not been seen by Dr. Agustin Cree  In over 1 year and missed her 3 month f/u visit in  Dec. 2019.     Scheduled f/u visit with Laurann Montana in [facility] office 01/09/19 and 2 week eliquis refill sent to [facility] in [place] per request.

## 2019-01-09 ENCOUNTER — Encounter: Payer: Self-pay | Admitting: Family

## 2019-01-09 ENCOUNTER — Ambulatory Visit (INDEPENDENT_AMBULATORY_CARE_PROVIDER_SITE_OTHER): Payer: Medicare Other | Admitting: Family

## 2019-01-09 ENCOUNTER — Other Ambulatory Visit: Payer: Self-pay

## 2019-01-09 ENCOUNTER — Telehealth: Payer: Self-pay | Admitting: Cardiology

## 2019-01-09 VITALS — BP 128/70 | HR 66 | Ht 62.0 in | Wt 187.4 lb

## 2019-01-09 DIAGNOSIS — I240 Acute coronary thrombosis not resulting in myocardial infarction: Secondary | ICD-10-CM

## 2019-01-09 DIAGNOSIS — I5022 Chronic systolic (congestive) heart failure: Secondary | ICD-10-CM

## 2019-01-09 DIAGNOSIS — I255 Ischemic cardiomyopathy: Secondary | ICD-10-CM | POA: Diagnosis not present

## 2019-01-09 DIAGNOSIS — I236 Thrombosis of atrium, auricular appendage, and ventricle as current complications following acute myocardial infarction: Secondary | ICD-10-CM | POA: Diagnosis not present

## 2019-01-09 MED ORDER — CARVEDILOL 12.5 MG PO TABS: 12.5000 mg | ORAL_TABLET | Freq: Two times a day (BID) | ORAL | 1 refills | Status: AC

## 2019-01-09 MED ORDER — APIXABAN 5 MG PO TABS: 5.0000 mg | ORAL_TABLET | Freq: Two times a day (BID) | ORAL | 0 refills | Status: DC

## 2019-01-09 NOTE — Progress Notes (Signed)
Office Visit    Patient Name: Christina Perry Date of Encounter: 01/09/2019  Primary Care Provider:  Ernestene Kiel, MD Primary Cardiologist:  No primary care provider on file. Electrophysiologist:  Will Meredith Leeds, MD   Chief Complaint    Christina Perry is a 68 y.o. female with a hx of systolic heart failure due to ICM, ICD s/p generator change 10/11/17, CAD s/p MI with DES to LM 68 yo, DM2, HTN, HLD, LV thrombus presents today for follow up if ICM and anticoagulation due to LV thrombus.   Past Medical History    Past Medical History:  Diagnosis Date  . Anxiety   . Cardiomyopathy ([place])   . Coronary artery stenosis   . Depression   . Diabetes mellitus without complication ([place])   . Hyperlipidemia   . Hypertension   . MI (myocardial infarction) ([place])   . Thyroid disease    Past Surgical History:  Procedure Laterality Date  . [place] SURGERY  2005  . CAROTID STENT    . CESAREAN SECTION  1979  . CORONARY ANGIOPLASTY    . ICD GENERATOR CHANGEOUT N/A 10/11/2017   Procedure: ICD GENERATOR CHANGEOUT;  Surgeon: Constance Haw, MD;  Location: [facility];  Service: Cardiovascular;  Laterality: N/A;  . ICD Placement    . TONSILLECTOMY      Allergies  Allergies  Allergen Reactions  . Cozaar [Losartan] Other (See Comments)    Gave her asthma-like symptoms  . Lisinopril Cough    History of Present Illness    Christina Perry is a 68 y.o. female with a hx of hx of systolic heart failure due to ICM, ICD s/p generator change 10/11/17, CAD s/p MI with DES to LM 68 yo, DM2, HTN, HLD, LV thrombus last seen by Dr. Agustin Cree 09/2017 and by Dr. Curt Bears 10/2018.  Her ICD was placed approximately 10 years ago in [place]. She follows closely with Dr. Curt Bears. Her ICM has been limited in its GDMT as she was intolerant of Lisinopril (cough) and Losartan (asthma like symptoms).  She reports feeling well. 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 Denies chest pain, SOB, DOE. 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 She takes Lasix 1-2 times per week with good result.   We discussed her history of LV thrombus. Interesting history as her MI was when she was 68 years old and thrombus was noted nearly twenty years later. She has been compliant with her Eliquis and denies bleeding complications. She is agreeable to repeat echocardiogram.  EKGs/Labs/Other Studies Reviewed:   The following studies were reviewed today: Echo 05/04/27 1. LA enlargement 2. Mild LV systolic dysfunction EF [id number] with mid to distal septal akinesis, anterior hypokinesis, and apical akinesis. The contrast definity was used to enhance evaluation of endocardial borders; the possibility of small apical thrombus cannot be ruled out.  3. Normal R heart structures. No significant TR to measure pressure 4. No AS/AI 5. Trace MR 6. No pericardial effusion.  09/2017 CT IMPRESSION: 1. Small mural apical thrombus present in setting of previous anterior wall MI with septal, apical and distal anterior wall akinesis   2. Patent stent in proximal LAD with possible hemodynamically significant residual disease in mid LAD and proximal circumflex   Misregistration artifact and artifact from AICD wires makes study unsuitable for FFR CT analysis   EKG:  NO EKG today. EKG independently reviewed from 11/14/18 shows SR with no acute ST/T wave changes.   Recent Labs: 09/21/18 via [facility]: A1c 6.8, ALT 14, AST 16, creatinine 1.17, GFR 48  Recent Lipid Panel 09/21/2018 via [facility]: total 137, HDL 63, LDL58, triglycerides 82  Home Medications   Current Meds  Medication Sig  . albuterol (VENTOLIN HFA) 108 (90 Base) MCG/ACT inhaler Can inhale two puffs every four to six hours as needed for cough or wheeze.  Marland Kitchen apixaban (ELIQUIS) 5 MG TABS tablet Take 1 tablet (5 mg total) by mouth 2 (two) times daily.  Marland Kitchen  atorvastatin (LIPITOR) 20 MG tablet Take 20 mg by mouth daily.  . carvedilol (COREG) 12.5 MG tablet Take 1 tablet (12.5 mg total) by mouth 2 (two) times daily.  Marland Kitchen glimepiride (AMARYL) 4 MG tablet Take 4 mg by mouth daily.  Marland Kitchen levothyroxine (SYNTHROID, LEVOTHROID) 175 MCG tablet TAKE 1 TABLET BY MOUTH ONCE DAILY ON AN EMPTY STOMACH IN THE MORNING  . Magnesium 500 MG TABS Take 1,000 mg by mouth 2 (two) times daily.   . metFORMIN (GLUCOPHAGE) 1000 MG tablet Take 1,000 mg by mouth 2 (two) times daily.  Marland Kitchen PARoxetine (PAXIL) 10 MG tablet Take 10 mg by mouth daily.  . [DISCONTINUED] apixaban (ELIQUIS) 5 MG TABS tablet Take 1 tablet (5 mg total) by mouth 2 (two) times daily.  . [DISCONTINUED] carvedilol (COREG) 12.5 MG tablet Take 12.5 mg by mouth 2 (two) times daily.     Review of Systems    Review of Systems  Constitution: Negative for chills, fever and malaise/fatigue.  Cardiovascular: Negative for chest pain, dyspnea on exertion, irregular heartbeat, leg swelling, near-syncope, orthopnea and palpitations.  Respiratory: Negative for cough, shortness of breath and wheezing.   Gastrointestinal: Negative for melena, nausea and vomiting.  Genitourinary: Negative for hematuria.  Neurological: Negative for dizziness, light-headedness and weakness.   All other systems reviewed and are otherwise negative except as noted above.  Physical Exam    VS:  BP 128/70 (BP Location: Right Arm, Patient Position: Sitting, Cuff Size: Normal)   Pulse 66   Ht 5\' 2"  (1.575 m)   Wt 187 lb 6.4 oz (85 kg)   SpO2 98%   BMI 34.28 kg/m  , BMI Body mass index is 34.28 kg/m. GEN: Well nourished, overweight, well developed, in no acute distress. HEENT: normal. Neck: Supple, no JVD, carotid bruits, or masses. Cardiac: RRR, no murmurs, rubs, or gallops. No clubbing, cyanosis. LLE with 1+ edema, known lymadema. Radials/DP/PT 2+ and equal bilaterally.  Respiratory:  Respirations regular and unlabored, clear to auscultation  bilaterally. GI: Soft, nontender, nondistended, BS + x 4. MS: No deformity or atrophy. Skin: Warm and dry, no rash. Neuro:  Strength and sensation are intact. Psych: Normal affect.  Assessment & Plan    1. CAD s/p MI and DES to LM at 68yo - Stable with no anginal symptoms. Continue GDMT statin and beta blocker. No aspirin secondary to anticoagulation. No indication for ischemic evaluation at this time.   2. LV thrombus - Noted on echo 04/2017 and cardiac CT 09/2017. Presently on Eliquis. Repeat echocardiogram. Consider discontinuation of Eliquis if thrombus has resolved. Would consider DAPT if transition off of anticoagulation.  3. Long term current use of anticoagulation - Secondary to LV thrombus. Denies bleeding complications Continue Eliquis.   4. Ischemic cardiomyopathy - Echo 04/2017. No SOB, DOE. Repeat echocardiogram. If EF reduced <40%, will require optimization of therapy - would consider adding low dose  Entresto or MRA.  5. HLD, LDL goal <70 - LDL at goal. Continue statin.  Disposition: Echo this week. Follow up in 4 week(s) with Dr. Agustin Cree.   Loel Dubonnet, NP 01/09/2019, 4:31 PM

## 2019-01-09 NOTE — Telephone Encounter (Signed)
Patient returned your call.  Please call her back.  She has appt today with Urban Gibson

## 2019-01-09 NOTE — Patient Instructions (Signed)
Medication Instructions:  No medication changes today.  *If you need a refill on your cardiac medications before your next appointment, please call your pharmacy*  Lab Work: No lab work today.  If you have labs (blood work) drawn today and your tests are completely normal, you will receive your results only by: Marland Kitchen MyChart Message (if you have MyChart) OR . A paper copy in the mail If you have any lab test that is abnormal or we need to change your treatment, we will call you to review the results.  Testing/Procedures: Your physician has requested that you have an echocardiogram. Echocardiography is a painless test that uses sound waves to create images of your heart. It provides your doctor with information about the size and shape of your heart and how well your heart's chambers and valves are working. This procedure takes approximately one hour. There are no restrictions for this procedure.  Follow-Up: At [facility], you and your health needs are our priority.  As part of our continuing mission to provide you with exceptional heart care, we have created designated Provider Care Teams.  These Care Teams include your primary Cardiologist (physician) and Advanced Practice Providers (APPs -  Physician Assistants and Nurse Practitioners) who all work together to provide you with the care you need, when you need it.  Your next appointment:   6 month(s)  The format for your next appointment:   In Person  Provider:   Jenne Campus, MD  Other Instructions

## 2019-01-10 NOTE — Telephone Encounter (Signed)
Phoned patient, left voice message that if she has any need to speak with me/if she still has unanswered questions after her office visit with Laurann Montana NP yesterday, she call me back at [phone].

## 2019-01-11 ENCOUNTER — Ambulatory Visit (INDEPENDENT_AMBULATORY_CARE_PROVIDER_SITE_OTHER): Payer: Medicare Other

## 2019-01-11 ENCOUNTER — Other Ambulatory Visit: Payer: Self-pay

## 2019-01-11 DIAGNOSIS — I255 Ischemic cardiomyopathy: Secondary | ICD-10-CM

## 2019-01-11 DIAGNOSIS — I5022 Chronic systolic (congestive) heart failure: Secondary | ICD-10-CM

## 2019-01-11 DIAGNOSIS — I236 Thrombosis of atrium, auricular appendage, and ventricle as current complications following acute myocardial infarction: Secondary | ICD-10-CM

## 2019-01-11 DIAGNOSIS — I240 Acute coronary thrombosis not resulting in myocardial infarction: Secondary | ICD-10-CM

## 2019-01-11 NOTE — Progress Notes (Signed)
Complete echocardiogram has been performed.  Jimmy Keandra Medero RDCS, RVT 

## 2019-01-24 ENCOUNTER — Ambulatory Visit (INDEPENDENT_AMBULATORY_CARE_PROVIDER_SITE_OTHER): Payer: Medicare Other | Admitting: *Deleted

## 2019-01-24 DIAGNOSIS — Z95810 Presence of automatic (implantable) cardiac defibrillator: Secondary | ICD-10-CM

## 2019-01-25 ENCOUNTER — Ambulatory Visit (INDEPENDENT_AMBULATORY_CARE_PROVIDER_SITE_OTHER): Payer: Medicare Other | Admitting: Cardiology

## 2019-01-25 ENCOUNTER — Ambulatory Visit: Payer: Medicare Other | Admitting: Cardiology

## 2019-01-25 ENCOUNTER — Encounter: Payer: Self-pay | Admitting: Cardiology

## 2019-01-25 ENCOUNTER — Other Ambulatory Visit: Payer: Self-pay

## 2019-01-25 VITALS — BP 120/64 | HR 71 | Ht 62.0 in | Wt 182.8 lb

## 2019-01-25 DIAGNOSIS — I236 Thrombosis of atrium, auricular appendage, and ventricle as current complications following acute myocardial infarction: Secondary | ICD-10-CM

## 2019-01-25 DIAGNOSIS — I2510 Atherosclerotic heart disease of native coronary artery without angina pectoris: Secondary | ICD-10-CM | POA: Diagnosis not present

## 2019-01-25 DIAGNOSIS — I255 Ischemic cardiomyopathy: Secondary | ICD-10-CM | POA: Diagnosis not present

## 2019-01-25 DIAGNOSIS — E785 Hyperlipidemia, unspecified: Secondary | ICD-10-CM | POA: Diagnosis not present

## 2019-01-25 DIAGNOSIS — Z95810 Presence of automatic (implantable) cardiac defibrillator: Secondary | ICD-10-CM | POA: Diagnosis not present

## 2019-01-25 DIAGNOSIS — Z79899 Other long term (current) drug therapy: Secondary | ICD-10-CM | POA: Diagnosis not present

## 2019-01-25 DIAGNOSIS — I10 Essential (primary) hypertension: Secondary | ICD-10-CM | POA: Diagnosis not present

## 2019-01-25 DIAGNOSIS — E1169 Type 2 diabetes mellitus with other specified complication: Secondary | ICD-10-CM | POA: Diagnosis not present

## 2019-01-25 LAB — CUP PACEART REMOTE DEVICE CHECK
Battery Remaining Longevity: 91 mo
Battery Voltage: 2.98 V
Brady Statistic AP VP Percent: 0 %
Brady Statistic AP VS Percent: 32.04 %
Brady Statistic AS VP Percent: 0 %
Brady Statistic AS VS Percent: 67.96 %
Brady Statistic RA Percent Paced: 31.95 %
Brady Statistic RV Percent Paced: 0 %
Date Time Interrogation Session: 20201208033621
HighPow Impedance: 47 Ohm
HighPow Impedance: 58 Ohm
Implantable Lead Implant Date: 20111212
Implantable Lead Implant Date: 20111212
Implantable Lead Location: 753859
Implantable Lead Location: 753860
Implantable Lead Model: 5076
Implantable Lead Model: 6947
Implantable Pulse Generator Implant Date: 20190826
Lead Channel Impedance Value: 285 Ohm
Lead Channel Impedance Value: 361 Ohm
Lead Channel Impedance Value: 361 Ohm
Lead Channel Pacing Threshold Amplitude: 1.5 V
Lead Channel Pacing Threshold Pulse Width: 0.4 ms
Lead Channel Sensing Intrinsic Amplitude: 0.875 mV
Lead Channel Sensing Intrinsic Amplitude: 0.875 mV
Lead Channel Sensing Intrinsic Amplitude: 7.25 mV
Lead Channel Sensing Intrinsic Amplitude: 7.25 mV
Lead Channel Setting Pacing Amplitude: 2.5 V
Lead Channel Setting Pacing Amplitude: 5 V
Lead Channel Setting Pacing Pulse Width: 1 ms
Lead Channel Setting Sensing Sensitivity: 0.3 mV

## 2019-01-25 NOTE — Progress Notes (Signed)
Cardiology Office Note:    Date:  01/25/2019   ID:  Christina Perry, DOB [birth year], MRN [id number]  PCP:  Ernestene Kiel, MD  Cardiologist:  Jenne Campus, MD    Referring MD: Ernestene Kiel, MD   Chief Complaint  Patient presents with  . Follow up on Echo  Doing very well  History of Present Illness:    Christina Perry is a 68 y.o. female with coronary artery disease status post myocardial infarction years ago with involved LAD, cardiomyopathy with akinesis of the apex, history of LV thrombus, diabetes, hypertension, dyslipidemia.  She comes today to my office for follow-up and talk about echocardiogram and LV thrombus we had a long discussion about this today.  Looking at her echocardiogram I do not see evidence of new thrombus however she still get akinesis involving apex obviously that creates substrate for potential left plaque.  I told her there is no scientific data guiding [place] for discount of clinical scenario in my opinion since she is tolerating Eliquis quite well I advised her to continue.  Overall she is doing well.  Denies having any cardiac issues right now still works part-time in [employer].  Past Medical History:  Diagnosis Date  . Anxiety   . Cardiomyopathy ([place])   . Coronary artery stenosis   . Depression   . Diabetes mellitus without complication ([place])   . Hyperlipidemia   . Hypertension   . MI (myocardial infarction) ([place])   . Thyroid disease     Past Surgical History:  Procedure Laterality Date  . [place] SURGERY  2005  . CAROTID STENT    . CESAREAN SECTION  1979  . CORONARY ANGIOPLASTY    . ICD GENERATOR CHANGEOUT N/A 10/11/2017   Procedure: ICD GENERATOR CHANGEOUT;  Surgeon: Constance Haw, MD;  Location: [facility];  Service: Cardiovascular;  Laterality: N/A;  . ICD Placement    . TONSILLECTOMY      Current Medications: Current Meds  Medication Sig  . albuterol (VENTOLIN HFA) 108 (90 Base) MCG/ACT inhaler Can  inhale two puffs every four to six hours as needed for cough or wheeze.  Marland Kitchen apixaban (ELIQUIS) 5 MG TABS tablet Take 1 tablet (5 mg total) by mouth 2 (two) times daily.  Marland Kitchen atorvastatin (LIPITOR) 20 MG tablet Take 20 mg by mouth daily.  . carvedilol (COREG) 12.5 MG tablet Take 1 tablet (12.5 mg total) by mouth 2 (two) times daily.  Marland Kitchen glimepiride (AMARYL) 4 MG tablet Take 4 mg by mouth daily.  Marland Kitchen levothyroxine (SYNTHROID, LEVOTHROID) 175 MCG tablet TAKE 1 TABLET BY MOUTH ONCE DAILY ON AN EMPTY STOMACH IN THE MORNING  . Magnesium 500 MG TABS Take 1,000 mg by mouth 2 (two) times daily.   . metFORMIN (GLUCOPHAGE) 1000 MG tablet Take 1,000 mg by mouth 2 (two) times daily.  Marland Kitchen PARoxetine (PAXIL) 10 MG tablet Take 10 mg by mouth daily.     Allergies:   Cozaar [losartan] and Lisinopril   Social History   Socioeconomic History  . Marital status: Married    Spouse name: Not on file  . Number of children: Not on file  . Years of education: Not on file  . Highest education level: Not on file  Occupational History  . Not on file  Social Needs  . Financial resource strain: Not on file  . Food insecurity    Worry: Not on file    Inability: Not on file  . Transportation needs  Medical: Not on file    Non-medical: Not on file  Tobacco Use  . Smoking status: Never Smoker  . Smokeless tobacco: Never Used  Substance and Sexual Activity  . Alcohol use: Yes    Comment: occ  . Drug use: No  . Sexual activity: Not on file  Lifestyle  . Physical activity    Days per week: Not on file    Minutes per session: Not on file  . Stress: Not on file  Relationships  . Social Herbalist on phone: Not on file    Gets together: Not on file    Attends religious service: Not on file    Active member of club or organization: Not on file    Attends meetings of clubs or organizations: Not on file    Relationship status: Not on file  Other Topics Concern  . Not on file  Social History Narrative   . Not on file     Family History: The patient's family history includes CAD in her mother; COPD in her mother; Diabetes in her maternal grandmother and mother; Stroke in her maternal grandmother and mother. ROS:   Please see the history of present illness.    All 14 point review of systems negative except as described per history of present illness  EKGs/Labs/Other Studies Reviewed:      Recent Labs: No results found for requested labs within last 8760 hours.  Recent Lipid Panel No results found for: CHOL, TRIG, HDL, CHOLHDL, VLDL, LDLCALC, LDLDIRECT  Physical Exam:    VS:  BP 120/64   Pulse 71   Ht 5\' 2"  (1.575 m)   Wt 182 lb 12.8 oz (82.9 kg)   SpO2 97%   BMI 33.43 kg/m     Wt Readings from Last 3 Encounters:  01/25/19 182 lb 12.8 oz (82.9 kg)  01/09/19 187 lb 6.4 oz (85 kg)  11/14/18 184 lb 12.8 oz (83.8 kg)     GEN:  Well nourished, well developed in no acute distress HEENT: Normal NECK: No JVD; No carotid bruits LYMPHATICS: No lymphadenopathy CARDIAC: RRR, no murmurs, no rubs, no gallops RESPIRATORY:  Clear to auscultation without rales, wheezing or rhonchi  ABDOMEN: Soft, non-tender, non-distended MUSCULOSKELETAL:  No edema; No deformity  SKIN: Warm and dry LOWER EXTREMITIES: no swelling NEUROLOGIC:  Alert and oriented x 3 PSYCHIATRIC:  Normal affect   ASSESSMENT:    1. LV (left ventricular) mural thrombus following MI ([place])   2. Coronary artery disease involving native coronary artery of native heart without angina pectoris   3. Ischemic cardiomyopathy   4. ICD (implantable cardioverter-defibrillator) in place   5. Dyslipidemia    PLAN:    In order of problems listed above:  1. Left ventricle thrombus none seen on echocardiogram and still substrate of akinesis is there.  Therefore discussion as above and we decided to continue with Eliquis. 2. Coronary disease stable without any issues. 3. Ischemic cardiomyopathy on appropriate medication that she  can tolerate which I will continue. 4. ICD present followed by our EP team 5. Diabetes mellitus.  I think she can benefit from [place].  I will leave it up to discretion of her primary care physician to make a switch from Amaryl.  From my standpoint of view it would be beneficial.   Medication Adjustments/Labs and Tests Ordered: Current medicines are reviewed at length with the patient today.  Concerns regarding medicines are outlined above.  No orders of the  defined types were placed in this encounter.  Medication changes: No orders of the defined types were placed in this encounter.   Signed, Park Liter, MD, [facility] 01/25/2019 2:58 PM    [place]

## 2019-01-25 NOTE — Patient Instructions (Signed)
Medication Instructions:  Your physician recommends that you continue on your current medications as directed. Please refer to the Current Medication list given to you today.  *If you need a refill on your cardiac medications before your next appointment, please call your pharmacy*  Lab Work: None.  If you have labs (blood work) drawn today and your tests are completely normal, you will receive your results only by: Marland Kitchen MyChart Message (if you have MyChart) OR . A paper copy in the mail If you have any lab test that is abnormal or we need to change your treatment, we will call you to review the results.  Testing/Procedures: None.   Follow-Up: At [facility], you and your health needs are our priority.  As part of our continuing mission to provide you with exceptional heart care, we have created designated Provider Care Teams.  These Care Teams include your primary Cardiologist (physician) and Advanced Practice Providers (APPs -  Physician Assistants and Nurse Practitioners) who all work together to provide you with the care you need, when you need it.  Your next appointment:   4 month(s)  The format for your next appointment:   In Person  Provider:   Jenne Campus, MD  Other Instructions

## 2019-02-21 ENCOUNTER — Telehealth: Payer: Self-pay | Admitting: Cardiology

## 2019-02-21 MED ORDER — APIXABAN 5 MG PO TABS: 5.0000 mg | ORAL_TABLET | Freq: Two times a day (BID) | ORAL | 1 refills | Status: DC

## 2019-02-21 NOTE — Addendum Note (Signed)
Addended by: Ashok Norris on: 02/21/2019 03:06 PM   Modules accepted: Orders

## 2019-02-21 NOTE — Telephone Encounter (Signed)
States she went to pick up her eliquis and they only gave her a 15 day supply

## 2019-02-21 NOTE — Telephone Encounter (Signed)
Unsure why patient had only a 15 day supply at pharmacy last RX does not match that. Refilled eliquis 5 mg daily for 90 day supple with one refill patient aware no further questions.

## 2019-02-23 DIAGNOSIS — Z6834 Body mass index (BMI) 34.0-34.9, adult: Secondary | ICD-10-CM | POA: Diagnosis not present

## 2019-02-23 DIAGNOSIS — R300 Dysuria: Secondary | ICD-10-CM | POA: Diagnosis not present

## 2019-02-28 DIAGNOSIS — N390 Urinary tract infection, site not specified: Secondary | ICD-10-CM | POA: Diagnosis not present

## 2019-04-25 ENCOUNTER — Ambulatory Visit (INDEPENDENT_AMBULATORY_CARE_PROVIDER_SITE_OTHER): Payer: Medicare Other | Admitting: *Deleted

## 2019-04-25 DIAGNOSIS — Z95810 Presence of automatic (implantable) cardiac defibrillator: Secondary | ICD-10-CM | POA: Diagnosis not present

## 2019-04-25 LAB — CUP PACEART REMOTE DEVICE CHECK
Battery Remaining Longevity: 90 mo
Battery Voltage: 2.97 V
Brady Statistic AP VP Percent: 0 %
Brady Statistic AP VS Percent: 29.62 %
Brady Statistic AS VP Percent: 0 %
Brady Statistic AS VS Percent: 70.38 %
Brady Statistic RA Percent Paced: 29.4 %
Brady Statistic RV Percent Paced: 0 %
Date Time Interrogation Session: 20210309022602
HighPow Impedance: 49 Ohm
HighPow Impedance: 65 Ohm
Implantable Lead Implant Date: 20111212
Implantable Lead Implant Date: 20111212
Implantable Lead Location: 753859
Implantable Lead Location: 753860
Implantable Lead Model: 5076
Implantable Lead Model: 6947
Implantable Pulse Generator Implant Date: 20190826
Lead Channel Impedance Value: 304 Ohm
Lead Channel Impedance Value: 399 Ohm
Lead Channel Impedance Value: 399 Ohm
Lead Channel Pacing Threshold Amplitude: 1.5 V
Lead Channel Pacing Threshold Pulse Width: 0.4 ms
Lead Channel Sensing Intrinsic Amplitude: 1 mV
Lead Channel Sensing Intrinsic Amplitude: 1 mV
Lead Channel Sensing Intrinsic Amplitude: 6.625 mV
Lead Channel Sensing Intrinsic Amplitude: 6.625 mV
Lead Channel Setting Pacing Amplitude: 2.5 V
Lead Channel Setting Pacing Amplitude: 5 V
Lead Channel Setting Pacing Pulse Width: 1 ms
Lead Channel Setting Sensing Sensitivity: 0.3 mV

## 2019-04-26 NOTE — Progress Notes (Signed)
ICD Remote  

## 2019-05-24 ENCOUNTER — Encounter: Payer: Self-pay | Admitting: Cardiology

## 2019-05-24 ENCOUNTER — Other Ambulatory Visit: Payer: Self-pay

## 2019-05-24 ENCOUNTER — Ambulatory Visit (INDEPENDENT_AMBULATORY_CARE_PROVIDER_SITE_OTHER): Payer: Medicare Other | Admitting: Cardiology

## 2019-05-24 VITALS — BP 118/70 | HR 108 | Temp 97.0°F | Ht 62.5 in | Wt 187.0 lb

## 2019-05-24 DIAGNOSIS — E785 Hyperlipidemia, unspecified: Secondary | ICD-10-CM

## 2019-05-24 DIAGNOSIS — E119 Type 2 diabetes mellitus without complications: Secondary | ICD-10-CM | POA: Diagnosis not present

## 2019-05-24 DIAGNOSIS — Z794 Long term (current) use of insulin: Secondary | ICD-10-CM | POA: Diagnosis not present

## 2019-05-24 DIAGNOSIS — I255 Ischemic cardiomyopathy: Secondary | ICD-10-CM | POA: Diagnosis not present

## 2019-05-24 DIAGNOSIS — I236 Thrombosis of atrium, auricular appendage, and ventricle as current complications following acute myocardial infarction: Secondary | ICD-10-CM

## 2019-05-24 DIAGNOSIS — I2510 Atherosclerotic heart disease of native coronary artery without angina pectoris: Secondary | ICD-10-CM | POA: Diagnosis not present

## 2019-05-24 DIAGNOSIS — Z95810 Presence of automatic (implantable) cardiac defibrillator: Secondary | ICD-10-CM | POA: Diagnosis not present

## 2019-05-24 NOTE — Patient Instructions (Signed)

## 2019-05-24 NOTE — Progress Notes (Signed)
Cardiology Office Note:    Date:  05/24/2019   ID:  Christina Perry, DOB [birth year], MRN [id number]  PCP:  Ernestene Kiel, MD  Cardiologist:  Jenne Campus, MD    Referring MD: Ernestene Kiel, MD   No chief complaint on file. Doing well without cough  History of Present Illness:    Christina Perry is a 69 y.o. female   with coronary artery disease status post myocardial infarction years ago with involved LAD, cardiomyopathy with akinesis of the apex, history of LV thrombus, diabetes, hypertension, dyslipidemia.  She comes today to my office for follow-up and talk about echocardiogram and LV thrombus we had a long discussion about this today.  Looking at her echocardiogram I do not see evidence of new thrombus however she still get akinesis involving apex obviously that creates substrate for potential left plaque.  I told her there is no scientific data guiding [place] for discount of clinical scenario in my opinion since she is tolerating Eliquis quite well I advised her to continue.  Overall she is doing well.  Biggest complaint she has is the fact that she has chronic cough different measures has been tried to help with that which include recently visit in allergy specialist office.  She was put on some sprays which seem to be helping to some degree.  Also recently she was put on proton pump inhibitor which seems to be helping anymore.  Cardiac wise denies have any chest pain tightness squeezing pressure burning chest no discharge from the defibrillator no swelling of lower extremities  Past Medical History:  Diagnosis Date  . Anxiety   . Cardiomyopathy ([place])   . Coronary artery disease involving native coronary artery of native heart without angina pectoris 11/02/2014   Overview:  Large anterior myocardial infarction and age of 36  . Coronary artery stenosis   . Depression   . Diabetes mellitus without complication ([place])   . Dyslipidemia 11/02/2014  . Hyperlipidemia   .  Hypertension   . 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 LV (left ventricular) mural thrombus following MI ([place]) 09/27/2017  . MI (myocardial infarction) ([place])   . Thyroid disease   . Type 2 diabetes mellitus without complication ([place]) [id number]    Past Surgical History:  Procedure Laterality Date  . [place] SURGERY  2005  . CAROTID STENT    . CESAREAN SECTION  1979  . CORONARY ANGIOPLASTY    . ICD GENERATOR CHANGEOUT N/A 10/11/2017   Procedure: ICD GENERATOR CHANGEOUT;  Surgeon: Constance Haw, MD;  Location: [place] [facility];  Service: Cardiovascular;  Laterality: N/A;  . ICD Placement    . TONSILLECTOMY      Current Medications: Current Meds  Medication Sig  . albuterol (VENTOLIN HFA) 108 (90 Base) MCG/ACT inhaler Can inhale two puffs every four to six hours as needed for cough or wheeze.  Marland Kitchen apixaban (ELIQUIS) 5 MG TABS tablet Take 1 tablet (5 mg total) by mouth 2 (two) times daily.  Marland Kitchen atorvastatin (LIPITOR) 20 MG tablet Take 20 mg by mouth daily.  . carvedilol (COREG) 12.5 MG tablet Take 1 tablet (12.5 mg total) by mouth 2 (two) times daily.  Marland Kitchen glimepiride (AMARYL) 4 MG tablet Take 4 mg by mouth daily.  Marland Kitchen levothyroxine (SYNTHROID, LEVOTHROID) 175 MCG tablet TAKE 1 TABLET BY MOUTH ONCE DAILY ON AN EMPTY STOMACH IN THE MORNING  . Magnesium 500 MG TABS Take 1,000 mg by mouth 2 (two) times daily.   Marland Kitchen  metFORMIN (GLUCOPHAGE) 1000 MG tablet Take 1,000 mg by mouth 2 (two) times daily.  . pantoprazole (PROTONIX) 20 MG tablet Take 20 mg by mouth daily.  Marland Kitchen PARoxetine (PAXIL) 10 MG tablet Take 10 mg by mouth daily.     Allergies:   Cozaar [losartan] and Lisinopril   Social History   Socioeconomic History  . Marital status: Married    Spouse name: Not on file  . Number of children: Not on file  . Years of education: Not on file  . Highest education level: Not on file  Occupational History  . Not on file  Tobacco Use  . Smoking status: Never  Smoker  . Smokeless tobacco: Never Used  Substance and Sexual Activity  . Alcohol use: Yes    Comment: occ  . Drug use: No  . Sexual activity: Not on file  Other Topics Concern  . Not on file  Social History Narrative  . Not on file   Social Determinants of Health   Financial Resource Strain:   . Difficulty of Paying Living Expenses:   Food Insecurity:   . Worried About Charity fundraiser in the Last Year:   . Arboriculturist in the Last Year:   Transportation Needs:   . Film/video editor (Medical):   Marland Kitchen Lack of Transportation (Non-Medical):   Physical Activity:   . Days of Exercise per Week:   . Minutes of Exercise per Session:   Stress:   . Feeling of Stress :   Social Connections:   . Frequency of Communication with Friends and Family:   . Frequency of Social Gatherings with Friends and Family:   . Attends Religious Services:   . Active Member of Clubs or Organizations:   . Attends Archivist Meetings:   Marland Kitchen Marital Status:      Family History: The patient's family history includes CAD in her mother; COPD in her mother; Diabetes in her maternal grandmother and mother; Stroke in her maternal grandmother and mother. ROS:   Please see the history of present illness.    All 14 point review of systems negative except as described per history of present illness  EKGs/Labs/Other Studies Reviewed:      Recent Labs: No results found for requested labs within last 8760 hours.  Recent Lipid Panel No results found for: CHOL, TRIG, HDL, CHOLHDL, VLDL, LDLCALC, LDLDIRECT  Physical Exam:    VS:  BP 118/70   Pulse (!) 108   Temp (!) 97 F (36.1 C)   Ht 5' 2.5" (1.588 m)   Wt 187 lb (84.8 kg)   SpO2 100%   BMI 33.66 kg/m     Wt Readings from Last 3 Encounters:  05/24/19 187 lb (84.8 kg)  01/25/19 182 lb 12.8 oz (82.9 kg)  01/09/19 187 lb 6.4 oz (85 kg)     GEN:  Well nourished, well developed in no acute distress HEENT: Normal NECK: No JVD; No  carotid bruits LYMPHATICS: No lymphadenopathy CARDIAC: RRR, no murmurs, no rubs, no gallops RESPIRATORY:  Clear to auscultation without rales, wheezing or rhonchi  ABDOMEN: Soft, non-tender, non-distended MUSCULOSKELETAL:  No edema; No deformity  SKIN: Warm and dry LOWER EXTREMITIES: no swelling NEUROLOGIC:  Alert and oriented x 3 PSYCHIATRIC:  Normal affect   ASSESSMENT:    1. Ischemic cardiomyopathy   2. Coronary artery disease involving native coronary artery of native heart without angina pectoris   3. LV (left ventricular) mural thrombus following MI ([place])  4. Type 2 diabetes mellitus without complication, with long-term current use of insulin ([place])   5. Dyslipidemia   6. ICD (implantable cardioverter-defibrillator) in place    PLAN:    In order of problems listed above:  1. Ischemic cardiomyopathy on beta-blocker, she is not on ACE inhibitor, ARB secondary to the fact that she has allergy to this class of medication.  Overall she is hemodynamically compensated.  I will continue present management. 2. 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  And my advice is to continue.  High risk of taking Eliquis alone, she did not have any bleeding so far. 3. Dyslipidemia I will call primary care physician to get fasting lipid profile.  I told her also that in her situation she need to be on high intensity statin.  Therefore, in the future will increase dose of Lipitor to most likely 40 mg daily. 4. ICD present, I did review interrogation battery status is good.  There is no discharge there is no detection of any arrhythmia.  Her OptiVol is stable.   Medication Adjustments/Labs and Tests Ordered: Current medicines are reviewed at length with the patient today.  Concerns regarding medicines are outlined above.  No  orders of the defined types were placed in this encounter.  Medication changes: No orders of the defined types were placed in this encounter.   Signed, Park Liter, MD, [facility] 05/24/2019 10:18 AM    [place]

## 2019-05-31 DIAGNOSIS — I10 Essential (primary) hypertension: Secondary | ICD-10-CM | POA: Diagnosis not present

## 2019-05-31 DIAGNOSIS — E1169 Type 2 diabetes mellitus with other specified complication: Secondary | ICD-10-CM | POA: Diagnosis not present

## 2019-05-31 DIAGNOSIS — E039 Hypothyroidism, unspecified: Secondary | ICD-10-CM | POA: Diagnosis not present

## 2019-05-31 DIAGNOSIS — E785 Hyperlipidemia, unspecified: Secondary | ICD-10-CM | POA: Diagnosis not present

## 2019-05-31 DIAGNOSIS — Z79899 Other long term (current) drug therapy: Secondary | ICD-10-CM | POA: Diagnosis not present

## 2019-05-31 DIAGNOSIS — D509 Iron deficiency anemia, unspecified: Secondary | ICD-10-CM | POA: Diagnosis not present

## 2019-07-12 DIAGNOSIS — Z79899 Other long term (current) drug therapy: Secondary | ICD-10-CM | POA: Diagnosis not present

## 2019-07-25 ENCOUNTER — Ambulatory Visit (INDEPENDENT_AMBULATORY_CARE_PROVIDER_SITE_OTHER): Payer: Medicare Other | Admitting: *Deleted

## 2019-07-25 DIAGNOSIS — I255 Ischemic cardiomyopathy: Secondary | ICD-10-CM

## 2019-07-25 LAB — CUP PACEART REMOTE DEVICE CHECK
Battery Remaining Longevity: 85 mo
Battery Voltage: 2.97 V
Brady Statistic AP VP Percent: 0 %
Brady Statistic AP VS Percent: 42.22 %
Brady Statistic AS VP Percent: 0 %
Brady Statistic AS VS Percent: 57.78 %
Brady Statistic RA Percent Paced: 41.96 %
Brady Statistic RV Percent Paced: 0 %
Date Time Interrogation Session: 20210608012404
HighPow Impedance: 49 Ohm
HighPow Impedance: 62 Ohm
Implantable Lead Implant Date: 20111212
Implantable Lead Implant Date: 20111212
Implantable Lead Location: 753859
Implantable Lead Location: 753860
Implantable Lead Model: 5076
Implantable Lead Model: 6947
Implantable Pulse Generator Implant Date: 20190826
Lead Channel Impedance Value: 304 Ohm
Lead Channel Impedance Value: 399 Ohm
Lead Channel Impedance Value: 399 Ohm
Lead Channel Pacing Threshold Amplitude: 1.5 V
Lead Channel Pacing Threshold Pulse Width: 0.4 ms
Lead Channel Sensing Intrinsic Amplitude: 1.125 mV
Lead Channel Sensing Intrinsic Amplitude: 1.125 mV
Lead Channel Sensing Intrinsic Amplitude: 5.875 mV
Lead Channel Sensing Intrinsic Amplitude: 5.875 mV
Lead Channel Setting Pacing Amplitude: 2.5 V
Lead Channel Setting Pacing Amplitude: 5 V
Lead Channel Setting Pacing Pulse Width: 1 ms
Lead Channel Setting Sensing Sensitivity: 0.3 mV

## 2019-07-26 NOTE — Progress Notes (Signed)
Remote ICD transmission.   

## 2019-09-09 ENCOUNTER — Other Ambulatory Visit: Payer: Self-pay | Admitting: Cardiology

## 2019-09-11 DIAGNOSIS — Z79899 Other long term (current) drug therapy: Secondary | ICD-10-CM | POA: Diagnosis not present

## 2019-10-18 DIAGNOSIS — K219 Gastro-esophageal reflux disease without esophagitis: Secondary | ICD-10-CM | POA: Diagnosis not present

## 2019-10-18 DIAGNOSIS — I25119 Atherosclerotic heart disease of native coronary artery with unspecified angina pectoris: Secondary | ICD-10-CM | POA: Diagnosis not present

## 2019-10-18 DIAGNOSIS — I10 Essential (primary) hypertension: Secondary | ICD-10-CM | POA: Diagnosis not present

## 2019-10-18 DIAGNOSIS — E039 Hypothyroidism, unspecified: Secondary | ICD-10-CM | POA: Diagnosis not present

## 2019-10-18 DIAGNOSIS — E1169 Type 2 diabetes mellitus with other specified complication: Secondary | ICD-10-CM | POA: Diagnosis not present

## 2019-10-18 DIAGNOSIS — E785 Hyperlipidemia, unspecified: Secondary | ICD-10-CM | POA: Diagnosis not present

## 2019-10-18 DIAGNOSIS — D509 Iron deficiency anemia, unspecified: Secondary | ICD-10-CM | POA: Diagnosis not present

## 2019-10-24 ENCOUNTER — Ambulatory Visit (INDEPENDENT_AMBULATORY_CARE_PROVIDER_SITE_OTHER): Payer: Medicare Other | Admitting: *Deleted

## 2019-10-24 DIAGNOSIS — I255 Ischemic cardiomyopathy: Secondary | ICD-10-CM

## 2019-10-24 LAB — CUP PACEART REMOTE DEVICE CHECK
Battery Remaining Longevity: 75 mo
Battery Voltage: 2.95 V
Brady Statistic AP VP Percent: 0 %
Brady Statistic AP VS Percent: 44.6 %
Brady Statistic AS VP Percent: 0 %
Brady Statistic AS VS Percent: 55.4 %
Brady Statistic RA Percent Paced: 44.21 %
Brady Statistic RV Percent Paced: 0 %
Date Time Interrogation Session: 20210907043722
HighPow Impedance: 45 Ohm
HighPow Impedance: 55 Ohm
Implantable Lead Implant Date: 20111212
Implantable Lead Implant Date: 20111212
Implantable Lead Location: 753859
Implantable Lead Location: 753860
Implantable Lead Model: 5076
Implantable Lead Model: 6947
Implantable Pulse Generator Implant Date: 20190826
Lead Channel Impedance Value: 304 Ohm
Lead Channel Impedance Value: 361 Ohm
Lead Channel Impedance Value: 399 Ohm
Lead Channel Pacing Threshold Amplitude: 1.5 V
Lead Channel Pacing Threshold Pulse Width: 0.4 ms
Lead Channel Sensing Intrinsic Amplitude: 0.875 mV
Lead Channel Sensing Intrinsic Amplitude: 0.875 mV
Lead Channel Sensing Intrinsic Amplitude: 6 mV
Lead Channel Sensing Intrinsic Amplitude: 6 mV
Lead Channel Setting Pacing Amplitude: 2.5 V
Lead Channel Setting Pacing Amplitude: 5 V
Lead Channel Setting Pacing Pulse Width: 1 ms
Lead Channel Setting Sensing Sensitivity: 0.3 mV

## 2019-10-25 NOTE — Progress Notes (Signed)
Remote ICD transmission.   

## 2019-11-02 DIAGNOSIS — J019 Acute sinusitis, unspecified: Secondary | ICD-10-CM | POA: Diagnosis not present

## 2019-11-02 DIAGNOSIS — Z20828 Contact with and (suspected) exposure to other viral communicable diseases: Secondary | ICD-10-CM | POA: Diagnosis not present

## 2019-11-08 ENCOUNTER — Other Ambulatory Visit: Payer: Self-pay

## 2019-11-08 ENCOUNTER — Ambulatory Visit (INDEPENDENT_AMBULATORY_CARE_PROVIDER_SITE_OTHER): Payer: Medicare Other | Admitting: Cardiology

## 2019-11-08 ENCOUNTER — Encounter: Payer: Self-pay | Admitting: Cardiology

## 2019-11-08 VITALS — BP 104/68 | HR 60 | Ht 62.5 in | Wt 189.0 lb

## 2019-11-08 DIAGNOSIS — E785 Hyperlipidemia, unspecified: Secondary | ICD-10-CM

## 2019-11-08 DIAGNOSIS — Z23 Encounter for immunization: Secondary | ICD-10-CM | POA: Diagnosis not present

## 2019-11-08 DIAGNOSIS — I2510 Atherosclerotic heart disease of native coronary artery without angina pectoris: Secondary | ICD-10-CM

## 2019-11-08 DIAGNOSIS — Z794 Long term (current) use of insulin: Secondary | ICD-10-CM | POA: Diagnosis not present

## 2019-11-08 DIAGNOSIS — I255 Ischemic cardiomyopathy: Secondary | ICD-10-CM | POA: Diagnosis not present

## 2019-11-08 DIAGNOSIS — Z95810 Presence of automatic (implantable) cardiac defibrillator: Secondary | ICD-10-CM | POA: Diagnosis not present

## 2019-11-08 DIAGNOSIS — E119 Type 2 diabetes mellitus without complications: Secondary | ICD-10-CM

## 2019-11-08 NOTE — Progress Notes (Signed)
Cardiology Office Note:    Date:  11/08/2019   ID:  Christina Perry, DOB 06-Aug-1950, MRN [id number]  PCP:  Ernestene Kiel, MD  Cardiologist:  Jenne Campus, MD    Referring MD: Ernestene Kiel, MD   Chief Complaint  Patient presents with  . Follow-up  I am doing well  History of Present Illness:    Christina Perry is a 69 y.o. female with past medical history significant for coronary artery disease, many years ago she got myocardial infarction that involve LAD territory that resulted with akinesis of apex also history of LV thrombus, diabetes, hypertension, dyslipidemia, ICD which is a [employer] device.  She comes today to my office for follow-up overall doing well.  Denies have any chest pain tightness squeezing pressure burning chest.  She is a Marine scientist and still work part-time.  Does not exercise on the regular basis  Past Medical History:  Diagnosis Date  . Anxiety   . Cardiomyopathy ([place])   . Coronary artery disease involving native coronary artery of native heart without angina pectoris 11/02/2014   Overview:  Large anterior myocardial infarction and age of 11  . Coronary artery stenosis   . Depression   . Diabetes mellitus without complication ([place])   . Dyslipidemia 11/02/2014  . Hyperlipidemia   . Hypertension   . 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 LV (left ventricular) mural thrombus following MI ([place]) 09/27/2017  . MI (myocardial infarction) ([place])   . Thyroid disease   . Type 2 diabetes mellitus without complication ([place]) 06/04/3788    Past Surgical History:  Procedure Laterality Date  . [place] SURGERY  2005  . CAROTID STENT    . CESAREAN SECTION  1979  . CORONARY ANGIOPLASTY    . ICD GENERATOR CHANGEOUT N/A 10/11/2017   Procedure: ICD GENERATOR CHANGEOUT;  Surgeon: Constance Haw, MD;  Location: [place] [facility];  Service: Cardiovascular;  Laterality: N/A;  . ICD Placement    . TONSILLECTOMY      Current  Medications: Current Meds  Medication Sig  . atorvastatin (LIPITOR) 20 MG tablet Take 20 mg by mouth daily.  . carvedilol (COREG) 12.5 MG tablet Take 1 tablet (12.5 mg total) by mouth 2 (two) times daily.  Marland Kitchen ELIQUIS 5 MG TABS tablet Take 1 tablet by mouth twice daily  . EUTHYROX 200 MCG tablet Take 200 mcg by mouth every morning.  . furosemide (LASIX) 20 MG tablet Take 40 mg by mouth daily as needed.  Marland Kitchen glimepiride (AMARYL) 4 MG tablet Take 4 mg by mouth daily.  . Magnesium 500 MG TABS Take 1,000 mg by mouth 2 (two) times daily.   . metFORMIN (GLUCOPHAGE) 1000 MG tablet Take 1,000 mg by mouth 2 (two) times daily.  . pantoprazole (PROTONIX) 40 MG tablet Take 40 mg by mouth daily.  Marland Kitchen PARoxetine (PAXIL) 10 MG tablet Take 10 mg by mouth daily.  . [DISCONTINUED] pantoprazole (PROTONIX) 20 MG tablet Take 20 mg by mouth daily.     Allergies:   Cozaar [losartan] and Lisinopril   Social History   Socioeconomic History  . Marital status: Married    Spouse name: Not on file  . Number of children: Not on file  . Years of education: Not on file  . Highest education level: Not on file  Occupational History  . Not on file  Tobacco Use  . Smoking status: Never Smoker  . Smokeless tobacco: Never Used  Vaping Use  . Vaping Use: Never  used  Substance and Sexual Activity  . Alcohol use: Yes    Comment: occ  . Drug use: No  . Sexual activity: Not on file  Other Topics Concern  . Not on file  Social History Narrative  . Not on file   Social Determinants of Health   Financial Resource Strain:   . Difficulty of Paying Living Expenses: Not on file  Food Insecurity:   . Worried About Charity fundraiser in the Last Year: Not on file  . Ran Out of Food in the Last Year: Not on file  Transportation Needs:   . Lack of Transportation (Medical): Not on file  . Lack of Transportation (Non-Medical): Not on file  Physical Activity:   . Days of Exercise per Week: Not on file  . Minutes of  Exercise per Session: Not on file  Stress:   . Feeling of Stress : Not on file  Social Connections:   . Frequency of Communication with Friends and Family: Not on file  . Frequency of Social Gatherings with Friends and Family: Not on file  . Attends Religious Services: Not on file  . Active Member of Clubs or Organizations: Not on file  . Attends Archivist Meetings: Not on file  . Marital Status: Not on file     Family History: The patient's family history includes CAD in her mother; COPD in her mother; Diabetes in her maternal grandmother and mother; Stroke in her maternal grandmother and mother. ROS:   Please see the history of present illness.    All 14 point review of systems negative except as described per history of present illness  EKGs/Labs/Other Studies Reviewed:      Recent Labs: No results found for requested labs within last 8760 hours.  Recent Lipid Panel No results found for: CHOL, TRIG, HDL, CHOLHDL, VLDL, LDLCALC, LDLDIRECT  Physical Exam:    VS:  BP 104/68 (BP Location: Left Arm, Patient Position: Sitting, Cuff Size: Large)   Pulse 60   Ht 5' 2.5" (1.588 m)   Wt 189 lb (85.7 kg)   SpO2 98%   BMI 34.02 kg/m     Wt Readings from Last 3 Encounters:  11/08/19 189 lb (85.7 kg)  05/24/19 187 lb (84.8 kg)  01/25/19 182 lb 12.8 oz (82.9 kg)     GEN:  Well nourished, well developed in no acute distress HEENT: Normal NECK: No JVD; No carotid bruits LYMPHATICS: No lymphadenopathy CARDIAC: RRR, no murmurs, no rubs, no gallops RESPIRATORY:  Clear to auscultation without rales, wheezing or rhonchi  ABDOMEN: Soft, non-tender, non-distended MUSCULOSKELETAL:  No edema; No deformity  SKIN: Warm and dry LOWER EXTREMITIES: no swelling NEUROLOGIC:  Alert and oriented x 3 PSYCHIATRIC:  Normal affect   ASSESSMENT:    1. Ischemic cardiomyopathy   2. Coronary artery disease involving native coronary artery of native heart without angina pectoris   3.  Type 2 diabetes mellitus without complication, with long-term current use of insulin ()   4. Dyslipidemia   5. ICD (implantable cardioverter-defibrillator) in place    PLAN:    In order of problems listed above:  1. Ischemic cardiomyopathy on all appropriate medications she is a intolerant to ACE inhibitor or ARB or Entresto, will continue rest of her medications.  Hemodynamically stable. 2. Coronary disease: No recent issue.  We will continue present management. 3. Type 2 diabetes I did review her K PN and I see last hemoglobin A1c 6.9 from May 31, 2019.  3 follow-up excellently by her primary care physician. 4. Dyslipidemia: She is on moderate intensity statin which I will continue.  I will call primary care physician to get her fasting blood profile. 5. ICD present: Interrogation reviewed.  And her OptiVol is stable.   Medication Adjustments/Labs and Tests Ordered: Current medicines are reviewed at length with the patient today.  Concerns regarding medicines are outlined above.  Orders Placed This Encounter  Procedures  . EKG 12-Lead   Medication changes: No orders of the defined types were placed in this encounter.   Signed, Park Liter, MD, [facility] 11/08/2019 9:35 AM    [place]

## 2019-11-08 NOTE — Patient Instructions (Signed)
Medication Instructions:  Your physician recommends that you continue on your current medications as directed. Please refer to the Current Medication list given to you today.  *If you need a refill on your cardiac medications before your next appointment, please call your pharmacy*   Lab Work: Your physician recommends that you continue on your current medications as directed. Please refer to the Current Medication list given to you today.  If you have labs (blood work) drawn today and your tests are completely normal, you will receive your results only by: . MyChart Message (if you have MyChart) OR . A paper copy in the mail If you have any lab test that is abnormal or we need to change your treatment, we will call you to review the results.   Testing/Procedures: None.    Follow-Up: At [facility], you and your health needs are our priority.  As part of our continuing mission to provide you with exceptional heart care, we have created designated Provider Care Teams.  These Care Teams include your primary Cardiologist (physician) and Advanced Practice Providers (APPs -  Physician Assistants and Nurse Practitioners) who all work together to provide you with the care you need, when you need it.  We recommend signing up for the patient portal called "MyChart".  Sign up information is provided on this After Visit Summary.  MyChart is used to connect with patients for Virtual Visits (Telemedicine).  Patients are able to view lab/test results, encounter notes, upcoming appointments, etc.  Non-urgent messages can be sent to your provider as well.   To learn more about what you can do with MyChart, go to [email or web address].    Your next appointment:   5 month(s)  The format for your next appointment:   In Person  Provider:   Robert Krasowski, MD   Other Instructions    

## 2019-12-13 DIAGNOSIS — Z79899 Other long term (current) drug therapy: Secondary | ICD-10-CM | POA: Diagnosis not present

## 2019-12-13 DIAGNOSIS — E039 Hypothyroidism, unspecified: Secondary | ICD-10-CM | POA: Diagnosis not present

## 2019-12-20 DIAGNOSIS — L814 Other melanin hyperpigmentation: Secondary | ICD-10-CM | POA: Diagnosis not present

## 2019-12-20 DIAGNOSIS — D2239 Melanocytic nevi of other parts of face: Secondary | ICD-10-CM | POA: Diagnosis not present

## 2019-12-20 DIAGNOSIS — L570 Actinic keratosis: Secondary | ICD-10-CM | POA: Diagnosis not present

## 2020-01-23 ENCOUNTER — Ambulatory Visit (INDEPENDENT_AMBULATORY_CARE_PROVIDER_SITE_OTHER): Payer: Medicare Other

## 2020-01-23 DIAGNOSIS — I255 Ischemic cardiomyopathy: Secondary | ICD-10-CM

## 2020-01-23 LAB — CUP PACEART REMOTE DEVICE CHECK
Battery Remaining Longevity: 71 mo
Battery Voltage: 2.95 V
Brady Statistic AP VP Percent: 0 %
Brady Statistic AP VS Percent: 37.85 %
Brady Statistic AS VP Percent: 0 %
Brady Statistic AS VS Percent: 62.15 %
Brady Statistic RA Percent Paced: 37.51 %
Brady Statistic RV Percent Paced: 0 %
Date Time Interrogation Session: 20211207022704
HighPow Impedance: 51 Ohm
HighPow Impedance: 66 Ohm
Implantable Lead Implant Date: 20111212
Implantable Lead Implant Date: 20111212
Implantable Lead Location: 753859
Implantable Lead Location: 753860
Implantable Lead Model: 5076
Implantable Lead Model: 6947
Implantable Pulse Generator Implant Date: 20190826
Lead Channel Impedance Value: 342 Ohm
Lead Channel Impedance Value: 418 Ohm
Lead Channel Impedance Value: 418 Ohm
Lead Channel Pacing Threshold Amplitude: 1.5 V
Lead Channel Pacing Threshold Pulse Width: 0.4 ms
Lead Channel Sensing Intrinsic Amplitude: 1 mV
Lead Channel Sensing Intrinsic Amplitude: 1 mV
Lead Channel Sensing Intrinsic Amplitude: 5.875 mV
Lead Channel Sensing Intrinsic Amplitude: 5.875 mV
Lead Channel Setting Pacing Amplitude: 2.5 V
Lead Channel Setting Pacing Amplitude: 5 V
Lead Channel Setting Pacing Pulse Width: 1 ms
Lead Channel Setting Sensing Sensitivity: 0.3 mV

## 2020-02-02 NOTE — Progress Notes (Signed)
Remote ICD transmission.   

## 2020-02-21 DIAGNOSIS — Z79899 Other long term (current) drug therapy: Secondary | ICD-10-CM | POA: Diagnosis not present

## 2020-02-21 DIAGNOSIS — E039 Hypothyroidism, unspecified: Secondary | ICD-10-CM | POA: Diagnosis not present

## 2020-02-21 DIAGNOSIS — I10 Essential (primary) hypertension: Secondary | ICD-10-CM | POA: Diagnosis not present

## 2020-02-21 DIAGNOSIS — E785 Hyperlipidemia, unspecified: Secondary | ICD-10-CM | POA: Diagnosis not present

## 2020-02-21 DIAGNOSIS — E1169 Type 2 diabetes mellitus with other specified complication: Secondary | ICD-10-CM | POA: Diagnosis not present

## 2020-03-12 DIAGNOSIS — N390 Urinary tract infection, site not specified: Secondary | ICD-10-CM | POA: Diagnosis not present

## 2020-03-26 DIAGNOSIS — D6869 Other thrombophilia: Secondary | ICD-10-CM | POA: Diagnosis not present

## 2020-03-26 DIAGNOSIS — E1169 Type 2 diabetes mellitus with other specified complication: Secondary | ICD-10-CM | POA: Diagnosis not present

## 2020-03-26 DIAGNOSIS — Z95810 Presence of automatic (implantable) cardiac defibrillator: Secondary | ICD-10-CM | POA: Diagnosis not present

## 2020-03-26 DIAGNOSIS — E039 Hypothyroidism, unspecified: Secondary | ICD-10-CM | POA: Diagnosis not present

## 2020-03-26 DIAGNOSIS — I25119 Atherosclerotic heart disease of native coronary artery with unspecified angina pectoris: Secondary | ICD-10-CM | POA: Diagnosis not present

## 2020-03-26 DIAGNOSIS — Z6834 Body mass index (BMI) 34.0-34.9, adult: Secondary | ICD-10-CM | POA: Diagnosis not present

## 2020-03-26 DIAGNOSIS — I255 Ischemic cardiomyopathy: Secondary | ICD-10-CM | POA: Diagnosis not present

## 2020-03-26 DIAGNOSIS — K219 Gastro-esophageal reflux disease without esophagitis: Secondary | ICD-10-CM | POA: Diagnosis not present

## 2020-03-26 DIAGNOSIS — I10 Essential (primary) hypertension: Secondary | ICD-10-CM | POA: Diagnosis not present

## 2020-03-26 DIAGNOSIS — N390 Urinary tract infection, site not specified: Secondary | ICD-10-CM | POA: Diagnosis not present

## 2020-03-26 DIAGNOSIS — E785 Hyperlipidemia, unspecified: Secondary | ICD-10-CM | POA: Diagnosis not present

## 2020-03-26 DIAGNOSIS — I480 Paroxysmal atrial fibrillation: Secondary | ICD-10-CM | POA: Diagnosis not present

## 2020-03-27 DIAGNOSIS — N390 Urinary tract infection, site not specified: Secondary | ICD-10-CM | POA: Diagnosis not present

## 2020-03-29 DIAGNOSIS — I219 Acute myocardial infarction, unspecified: Secondary | ICD-10-CM | POA: Insufficient documentation

## 2020-03-29 DIAGNOSIS — I10 Essential (primary) hypertension: Secondary | ICD-10-CM | POA: Insufficient documentation

## 2020-03-29 DIAGNOSIS — I2510 Atherosclerotic heart disease of native coronary artery without angina pectoris: Secondary | ICD-10-CM | POA: Insufficient documentation

## 2020-03-29 DIAGNOSIS — E785 Hyperlipidemia, unspecified: Secondary | ICD-10-CM | POA: Insufficient documentation

## 2020-03-29 DIAGNOSIS — F419 Anxiety disorder, unspecified: Secondary | ICD-10-CM | POA: Insufficient documentation

## 2020-03-29 DIAGNOSIS — F32A Depression, unspecified: Secondary | ICD-10-CM | POA: Insufficient documentation

## 2020-03-29 DIAGNOSIS — E079 Disorder of thyroid, unspecified: Secondary | ICD-10-CM | POA: Insufficient documentation

## 2020-03-29 DIAGNOSIS — E119 Type 2 diabetes mellitus without complications: Secondary | ICD-10-CM | POA: Insufficient documentation

## 2020-04-10 ENCOUNTER — Other Ambulatory Visit: Payer: Self-pay

## 2020-04-10 ENCOUNTER — Ambulatory Visit: Payer: HMO | Admitting: Cardiology

## 2020-04-10 ENCOUNTER — Encounter: Payer: Self-pay | Admitting: Cardiology

## 2020-04-10 VITALS — BP 116/72 | HR 92 | Ht 63.0 in | Wt 185.0 lb

## 2020-04-10 DIAGNOSIS — I2510 Atherosclerotic heart disease of native coronary artery without angina pectoris: Secondary | ICD-10-CM

## 2020-04-10 DIAGNOSIS — E119 Type 2 diabetes mellitus without complications: Secondary | ICD-10-CM | POA: Diagnosis not present

## 2020-04-10 DIAGNOSIS — I429 Cardiomyopathy, unspecified: Secondary | ICD-10-CM

## 2020-04-10 DIAGNOSIS — Z95810 Presence of automatic (implantable) cardiac defibrillator: Secondary | ICD-10-CM | POA: Diagnosis not present

## 2020-04-10 DIAGNOSIS — E785 Hyperlipidemia, unspecified: Secondary | ICD-10-CM | POA: Diagnosis not present

## 2020-04-10 NOTE — Patient Instructions (Signed)
Medication Instructions:  Your physician recommends that you continue on your current medications as directed. Please refer to the Current Medication list given to you today.  *If you need a refill on your cardiac medications before your next appointment, please call your pharmacy*   Lab Work: None. If you have labs (blood work) drawn today and your tests are completely normal, you will receive your results only by: . MyChart Message (if you have MyChart) OR . A paper copy in the mail If you have any lab test that is abnormal or we need to change your treatment, we will call you to review the results.   Testing/Procedures: Your physician has requested that you have an echocardiogram. Echocardiography is a painless test that uses sound waves to create images of your heart. It provides your doctor with information about the size and shape of your heart and how well your heart's chambers and valves are working. This procedure takes approximately one hour. There are no restrictions for this procedure.     Follow-Up: At [facility], you and your health needs are our priority.  As part of our continuing mission to provide you with exceptional heart care, we have created designated Provider Care Teams.  These Care Teams include your primary Cardiologist (physician) and Advanced Practice Providers (APPs -  Physician Assistants and Nurse Practitioners) who all work together to provide you with the care you need, when you need it.  We recommend signing up for the patient portal called "MyChart".  Sign up information is provided on this After Visit Summary.  MyChart is used to connect with patients for Virtual Visits (Telemedicine).  Patients are able to view lab/test results, encounter notes, upcoming appointments, etc.  Non-urgent messages can be sent to your provider as well.   To learn more about what you can do with MyChart, go to [email or web address].    Your next appointment:   6  month(s)  The format for your next appointment:   In Person  Provider:   Robert Krasowski, MD   Other Instructions   Echocardiogram An echocardiogram is a test that uses sound waves (ultrasound) to produce images of the heart. Images from an echocardiogram can provide important information about:  Heart size and shape.  The size and thickness and movement of your heart's walls.  Heart muscle function and strength.  Heart valve function or if you have stenosis. Stenosis is when the heart valves are too narrow.  If blood is flowing backward through the heart valves (regurgitation).  A tumor or infectious growth around the heart valves.  Areas of heart muscle that are not working well because of poor blood flow or injury from a heart attack.  Aneurysm detection. An aneurysm is a weak or damaged part of an artery wall. The wall bulges out from the normal force of blood pumping through the body. Tell a health care provider about:  Any allergies you have.  All medicines you are taking, including vitamins, herbs, eye drops, creams, and over-the-counter medicines.  Any blood disorders you have.  Any surgeries you have had.  Any medical conditions you have.  Whether you are pregnant or may be pregnant. What are the risks? Generally, this is a safe test. However, problems may occur, including an allergic reaction to dye (contrast) that may be used during the test. What happens before the test? No specific preparation is needed. You may eat and drink normally. What happens during the test?  You will take off your   clothes from the waist up and put on a hospital gown.  Electrodes or electrocardiogram (ECG)patches may be placed on your chest. The electrodes or patches are then connected to a device that monitors your heart rate and rhythm.  You will lie down on a table for an ultrasound exam. A gel will be applied to your chest to help sound waves pass through your skin.  A  handheld device, called a transducer, will be pressed against your chest and moved over your heart. The transducer produces sound waves that travel to your heart and bounce back (or "echo" back) to the transducer. These sound waves will be captured in real-time and changed into images of your heart that can be viewed on a video monitor. The images will be recorded on a computer and reviewed by your health care provider.  You may be asked to change positions or hold your breath for a short time. This makes it easier to get different views or better views of your heart.  In some cases, you may receive contrast through an IV in one of your veins. This can improve the quality of the pictures from your heart. The procedure may vary among health care providers and hospitals.   What can I expect after the test? You may return to your normal, everyday life, including diet, activities, and medicines, unless your health care provider tells you not to do that. Follow these instructions at home:  It is up to you to get the results of your test. Ask your health care provider, or the department that is doing the test, when your results will be ready.  Keep all follow-up visits. This is important. Summary  An echocardiogram is a test that uses sound waves (ultrasound) to produce images of the heart.  Images from an echocardiogram can provide important information about the size and shape of your heart, heart muscle function, heart valve function, and other possible heart problems.  You do not need to do anything to prepare before this test. You may eat and drink normally.  After the echocardiogram is completed, you may return to your normal, everyday life, unless your health care provider tells you not to do that. This information is not intended to replace advice given to you by your health care provider. Make sure you discuss any questions you have with your health care provider. Document Revised:  09/26/2019 Document Reviewed: 09/26/2019 Elsevier Patient Education  2021 Elsevier Inc.   

## 2020-04-10 NOTE — Progress Notes (Signed)
Cardiology Office Note:    Date:  04/10/2020   ID:  Christina Perry, DOB 16-Dec-1950, MRN [id number]  PCP:  Christina Kiel, MD  Cardiologist:  Christina Campus, MD    Referring MD: Christina Kiel, MD   Chief Complaint  Patient presents with  . Follow-up    History of Present Illness:    Christina Perry is a 70 y.o. female with past medical history significant for coronary artery disease status post myocardial infarction in 1996 which involve LAD territory which left her with akinesis of the apex and LV thrombus.  She also history of diabetes, hypertension, dyslipidemia, ICD which is Medtronic device. She comes today 2 months for follow-up overall doing well.  She is asymptomatic there is no chest pain tightness squeezing pressure burning chest.  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  Past Medical History:  Diagnosis Date  . Anxiety   . Cardiomyopathy ([place])   . Coronary artery disease involving native coronary artery of native heart without angina pectoris 11/02/2014   Overview:  Large anterior myocardial infarction and age of 11  . Coronary artery stenosis   . Defibrillator discharge 11/02/2014   Formatting of this note might be different from the original. Medtronic  . Depression   . Diabetes mellitus without complication ([place])   . Dyslipidemia 11/02/2014  . Hyperlipidemia   . Hypertension   . 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 LV (left ventricular) mural thrombus following MI ([place]) 09/27/2017  . MI (myocardial infarction) ([place])   . Thyroid disease   . Type 2 diabetes mellitus without complication ([place]) 9/93/7169    Past Surgical History:  Procedure Laterality Date  . [place] SURGERY  2005  . CAROTID STENT    . CESAREAN SECTION  1979  . CORONARY ANGIOPLASTY    . ICD GENERATOR CHANGEOUT N/A 10/11/2017   Procedure: ICD GENERATOR CHANGEOUT;  Surgeon: Christina Haw, MD;  Location: [facility];  Service:  Cardiovascular;  Laterality: N/A;  . ICD Placement    . TONSILLECTOMY      Current Medications: Current Meds  Medication Sig  . atorvastatin (LIPITOR) 20 MG tablet Take 20 mg by mouth daily.  . carvedilol (COREG) 12.5 MG tablet Take 1 tablet (12.5 mg total) by mouth 2 (two) times daily.  Marland Kitchen ELIQUIS 5 MG TABS tablet Take 1 tablet by mouth twice daily  . EUTHYROX 200 MCG tablet Take 200 mcg by mouth every morning.  . furosemide (LASIX) 20 MG tablet Take 40 mg by mouth daily as needed for fluid.  Marland Kitchen glimepiride (AMARYL) 4 MG tablet Take 4 mg by mouth daily.  . Magnesium 500 MG TABS Take 500 mg by mouth 2 (two) times daily. 400-500 mg bid  . metFORMIN (GLUCOPHAGE) 1000 MG tablet Take 1,000 mg by mouth 2 (two) times daily.  . pantoprazole (PROTONIX) 40 MG tablet Take 40 mg by mouth daily.  Marland Kitchen PARoxetine (PAXIL) 10 MG tablet Take 10 mg by mouth daily.     Allergies:   Cozaar [losartan] and Lisinopril   Social History   Socioeconomic History  . Marital status: Married    Spouse name: Not on file  . Number of children: Not on file  . Years of education: Not on file  . Highest education level: Not on file  Occupational History  . Not on file  Tobacco Use  . Smoking status: Never Smoker  . Smokeless tobacco: Never Used  Vaping Use  .  Vaping Use: Never used  Substance and Sexual Activity  . Alcohol use: Yes    Comment: occ  . Drug use: No  . Sexual activity: Not on file  Other Topics Concern  . Not on file  Social History Narrative  . Not on file   Social Determinants of Health   Financial Resource Strain: Not on file  Food Insecurity: Not on file  Transportation Needs: Not on file  Physical Activity: Not on file  Stress: Not on file  Social Connections: Not on file     Family History: The patient's family history includes CAD in her mother; COPD in her mother; Diabetes in her maternal grandmother and mother; Stroke in her maternal grandmother and mother. ROS:   Please  see the history of present illness.    All 14 point review of systems negative except as described per history of present illness  EKGs/Labs/Other Studies Reviewed:      Recent Labs: No results found for requested labs within last 8760 hours.  Recent Lipid Panel No results found for: CHOL, TRIG, HDL, CHOLHDL, VLDL, LDLCALC, LDLDIRECT  Physical Exam:    VS:  BP 116/72 (BP Location: Left Arm, Patient Position: Sitting)   Pulse 92   Ht 5\' 3"  (1.6 m)   Wt 185 lb (83.9 kg)   SpO2 93%   BMI 32.77 kg/m     Wt Readings from Last 3 Encounters:  04/10/20 185 lb (83.9 kg)  11/08/19 189 lb (85.7 kg)  05/24/19 187 lb (84.8 kg)     GEN:  Well nourished, well developed in no acute distress HEENT: Normal NECK: No JVD; No carotid bruits LYMPHATICS: No lymphadenopathy CARDIAC: RRR, no murmurs, no rubs, no gallops RESPIRATORY:  Clear to auscultation without rales, wheezing or rhonchi  ABDOMEN: Soft, non-tender, non-distended MUSCULOSKELETAL:  No edema; No deformity  SKIN: Warm and dry LOWER EXTREMITIES: no swelling NEUROLOGIC:  Alert and oriented x 3 PSYCHIATRIC:  Normal affect   ASSESSMENT:    1. Cardiomyopathy, unspecified type ([place])   2. Coronary artery disease involving native coronary artery of native heart without angina pectoris   3. Diabetes mellitus without complication ([place])   4. Dyslipidemia   5. ICD (implantable cardioverter-defibrillator) in place    PLAN:    In order of problems listed above:  1. Cardiomyopathy I will repeat her echocardiogram to recheck left ventricle ejection fraction, she is on beta-blocker however she is not ACE inhibitor or ARB Entresto because of intolerance to that class of medications. 2. Coronary artery disease stable no symptoms.  Continue present medications. 3. Diabetes, her hemoglobin A1c last time checked October 18, 2019 this is by K PN is 6.9.  She need to do a lipid better control of her diabetes but overall doing  well. 4. Dyslipidemia I do have her lipid profile which show me LDL of 58 HDL 56.  We will continue present medication which include high intensity statin. 5. LV thrombus she is on Eliquis which I will continue. 6. ICD present is a Medtronic device I did review interrogation normal function.  OptiVol stable.   Medication Adjustments/Labs and Tests Ordered: Current medicines are reviewed at length with the patient today.  Concerns regarding medicines are outlined above.  Orders Placed This Encounter  Procedures  . ECHOCARDIOGRAM COMPLETE   Medication changes: No orders of the defined types were placed in this encounter.   Signed, Park Liter, MD, [facility] 04/10/2020 2:10 PM    [facility]

## 2020-04-17 DIAGNOSIS — E039 Hypothyroidism, unspecified: Secondary | ICD-10-CM | POA: Diagnosis not present

## 2020-04-23 ENCOUNTER — Ambulatory Visit (INDEPENDENT_AMBULATORY_CARE_PROVIDER_SITE_OTHER): Payer: HMO

## 2020-04-23 DIAGNOSIS — I429 Cardiomyopathy, unspecified: Secondary | ICD-10-CM | POA: Diagnosis not present

## 2020-04-23 LAB — CUP PACEART REMOTE DEVICE CHECK
Battery Remaining Longevity: 65 mo
Battery Voltage: 2.94 V
Brady Statistic AP VP Percent: 0 %
Brady Statistic AP VS Percent: 33.21 %
Brady Statistic AS VP Percent: 0 %
Brady Statistic AS VS Percent: 66.79 %
Brady Statistic RA Percent Paced: 32.97 %
Brady Statistic RV Percent Paced: 0 %
Date Time Interrogation Session: 20220308012302
HighPow Impedance: 45 Ohm
HighPow Impedance: 56 Ohm
Implantable Lead Implant Date: 20111212
Implantable Lead Implant Date: 20111212
Implantable Lead Location: 753859
Implantable Lead Location: 753860
Implantable Lead Model: 5076
Implantable Lead Model: 6947
Implantable Pulse Generator Implant Date: 20190826
Lead Channel Impedance Value: 304 Ohm
Lead Channel Impedance Value: 361 Ohm
Lead Channel Impedance Value: 399 Ohm
Lead Channel Pacing Threshold Amplitude: 1.5 V
Lead Channel Pacing Threshold Pulse Width: 0.4 ms
Lead Channel Sensing Intrinsic Amplitude: 0.875 mV
Lead Channel Sensing Intrinsic Amplitude: 0.875 mV
Lead Channel Sensing Intrinsic Amplitude: 5.25 mV
Lead Channel Sensing Intrinsic Amplitude: 5.25 mV
Lead Channel Setting Pacing Amplitude: 2.5 V
Lead Channel Setting Pacing Amplitude: 5 V
Lead Channel Setting Pacing Pulse Width: 1 ms
Lead Channel Setting Sensing Sensitivity: 0.3 mV

## 2020-05-01 NOTE — Progress Notes (Signed)
Remote ICD transmission.   

## 2020-05-02 DIAGNOSIS — H524 Presbyopia: Secondary | ICD-10-CM | POA: Diagnosis not present

## 2020-05-02 DIAGNOSIS — H25813 Combined forms of age-related cataract, bilateral: Secondary | ICD-10-CM | POA: Diagnosis not present

## 2020-05-02 DIAGNOSIS — E113293 Type 2 diabetes mellitus with mild nonproliferative diabetic retinopathy without macular edema, bilateral: Secondary | ICD-10-CM | POA: Diagnosis not present

## 2020-05-03 DIAGNOSIS — N952 Postmenopausal atrophic vaginitis: Secondary | ICD-10-CM | POA: Diagnosis not present

## 2020-05-03 DIAGNOSIS — N390 Urinary tract infection, site not specified: Secondary | ICD-10-CM | POA: Diagnosis not present

## 2020-05-03 DIAGNOSIS — Z79899 Other long term (current) drug therapy: Secondary | ICD-10-CM | POA: Diagnosis not present

## 2020-05-13 ENCOUNTER — Ambulatory Visit (INDEPENDENT_AMBULATORY_CARE_PROVIDER_SITE_OTHER): Payer: HMO

## 2020-05-13 ENCOUNTER — Other Ambulatory Visit: Payer: Self-pay

## 2020-05-13 DIAGNOSIS — I2510 Atherosclerotic heart disease of native coronary artery without angina pectoris: Secondary | ICD-10-CM | POA: Diagnosis not present

## 2020-05-13 DIAGNOSIS — I429 Cardiomyopathy, unspecified: Secondary | ICD-10-CM | POA: Diagnosis not present

## 2020-05-13 LAB — ECHOCARDIOGRAM COMPLETE
Area-P 1/2: 3.95 cm2
Calc EF: 38.2 %
S' Lateral: 4.8 cm
Single Plane A2C EF: 36 %
Single Plane A4C EF: 39.7 %

## 2020-05-13 MED ORDER — PERFLUTREN LIPID MICROSPHERE
1.0000 mL | INTRAVENOUS | Status: AC | PRN
  Administered 2020-05-13: 7 mL via INTRAVENOUS

## 2020-05-13 NOTE — Progress Notes (Signed)
Complete echocardiogram with contrast performed.  Jimmy Erisa Mehlman RDCS, RVT  

## 2020-05-14 ENCOUNTER — Telehealth: Payer: Self-pay

## 2020-05-14 DIAGNOSIS — Z79899 Other long term (current) drug therapy: Secondary | ICD-10-CM

## 2020-05-14 NOTE — Telephone Encounter (Signed)
Patient notified of test results 

## 2020-05-14 NOTE — Telephone Encounter (Signed)
-----   Message from Park Liter, MD sent at 05/14/2020  8:35 AM EDT ----- Echocardiogram showed ejection fraction 30 to 35% similar to previously.  Overall looks fine.  Not much change compared to before

## 2020-05-15 DIAGNOSIS — Z6835 Body mass index (BMI) 35.0-35.9, adult: Secondary | ICD-10-CM | POA: Diagnosis not present

## 2020-05-15 DIAGNOSIS — I255 Ischemic cardiomyopathy: Secondary | ICD-10-CM | POA: Diagnosis not present

## 2020-05-15 DIAGNOSIS — R300 Dysuria: Secondary | ICD-10-CM | POA: Diagnosis not present

## 2020-05-15 DIAGNOSIS — N390 Urinary tract infection, site not specified: Secondary | ICD-10-CM | POA: Diagnosis not present

## 2020-05-17 DIAGNOSIS — Z6835 Body mass index (BMI) 35.0-35.9, adult: Secondary | ICD-10-CM | POA: Diagnosis not present

## 2020-05-17 DIAGNOSIS — N390 Urinary tract infection, site not specified: Secondary | ICD-10-CM | POA: Diagnosis not present

## 2020-05-17 DIAGNOSIS — T7840XA Allergy, unspecified, initial encounter: Secondary | ICD-10-CM | POA: Diagnosis not present

## 2020-05-17 NOTE — Telephone Encounter (Signed)
Patient came in office today asking to discuss echo results further. She doesn't remember her ef being this low previously and is concerned. She wants to talk to Dr. Agustin Cree more about the issue and wants recommendation on how fix this. She was concerned with her ef. Advised her I will check with Dr. Agustin Cree to see if he has recommendations on have we can work on this for her and get back with her Monday she understood.

## 2020-05-21 DIAGNOSIS — T7840XD Allergy, unspecified, subsequent encounter: Secondary | ICD-10-CM | POA: Diagnosis not present

## 2020-05-21 DIAGNOSIS — N3000 Acute cystitis without hematuria: Secondary | ICD-10-CM | POA: Diagnosis not present

## 2020-05-21 DIAGNOSIS — Z6834 Body mass index (BMI) 34.0-34.9, adult: Secondary | ICD-10-CM | POA: Diagnosis not present

## 2020-05-22 NOTE — Telephone Encounter (Signed)
Called patient informed her Dr. Agustin Cree would like to check labs before adding medication. Also added some new allergies to her allergy list. No further questions at this time. She will come for labs.

## 2020-05-22 NOTE — Telephone Encounter (Signed)
Lets get Chem-7 and see if we can start her on ARB, ACE inhibitor, Entresto her ejection fraction dropped by 5%.  This note horrible blood sugar and have concerning and there is a room to add some medication to improve this

## 2020-05-22 NOTE — Addendum Note (Signed)
Addended by: Senaida Ores on: 05/22/2020 08:54 AM   Modules accepted: Orders

## 2020-05-27 DIAGNOSIS — Z79899 Other long term (current) drug therapy: Secondary | ICD-10-CM | POA: Diagnosis not present

## 2020-05-28 LAB — BASIC METABOLIC PANEL
BUN/Creatinine Ratio: 24 (ref 12–28)
BUN: 30 mg/dL — ABNORMAL HIGH (ref 8–27)
CO2: 22 mmol/L (ref 20–29)
Calcium: 9.5 mg/dL (ref 8.7–10.3)
Chloride: 99 mmol/L (ref 96–106)
Creatinine, Ser: 1.24 mg/dL — ABNORMAL HIGH (ref 0.57–1.00)
Glucose: 103 mg/dL — ABNORMAL HIGH (ref 65–99)
Potassium: 4.4 mmol/L (ref 3.5–5.2)
Sodium: 138 mmol/L (ref 134–144)
eGFR: 47 mL/min/{1.73_m2} — ABNORMAL LOW (ref >59)

## 2020-06-06 DIAGNOSIS — T7840XD Allergy, unspecified, subsequent encounter: Secondary | ICD-10-CM | POA: Diagnosis not present

## 2020-07-23 ENCOUNTER — Ambulatory Visit (INDEPENDENT_AMBULATORY_CARE_PROVIDER_SITE_OTHER): Payer: HMO

## 2020-07-23 DIAGNOSIS — I255 Ischemic cardiomyopathy: Secondary | ICD-10-CM | POA: Diagnosis not present

## 2020-07-23 LAB — CUP PACEART REMOTE DEVICE CHECK
Battery Remaining Longevity: 57 mo
Battery Voltage: 2.93 V
Brady Statistic AP VP Percent: 0 %
Brady Statistic AP VS Percent: 39.73 %
Brady Statistic AS VP Percent: 0 %
Brady Statistic AS VS Percent: 60.27 %
Brady Statistic RA Percent Paced: 39.35 %
Brady Statistic RV Percent Paced: 0 %
Date Time Interrogation Session: 20220607043822
HighPow Impedance: 46 Ohm
HighPow Impedance: 55 Ohm
Implantable Lead Implant Date: 20111212
Implantable Lead Implant Date: 20111212
Implantable Lead Location: 753859
Implantable Lead Location: 753860
Implantable Lead Model: 5076
Implantable Lead Model: 6947
Implantable Pulse Generator Implant Date: 20190826
Lead Channel Impedance Value: 342 Ohm
Lead Channel Impedance Value: 361 Ohm
Lead Channel Impedance Value: 361 Ohm
Lead Channel Pacing Threshold Amplitude: 1.5 V
Lead Channel Pacing Threshold Pulse Width: 0.4 ms
Lead Channel Sensing Intrinsic Amplitude: 1.125 mV
Lead Channel Sensing Intrinsic Amplitude: 1.125 mV
Lead Channel Sensing Intrinsic Amplitude: 6.625 mV
Lead Channel Sensing Intrinsic Amplitude: 6.625 mV
Lead Channel Setting Pacing Amplitude: 2.5 V
Lead Channel Setting Pacing Amplitude: 5 V
Lead Channel Setting Pacing Pulse Width: 1 ms
Lead Channel Setting Sensing Sensitivity: 0.3 mV

## 2020-07-31 DIAGNOSIS — Z1331 Encounter for screening for depression: Secondary | ICD-10-CM | POA: Diagnosis not present

## 2020-07-31 DIAGNOSIS — Z0001 Encounter for general adult medical examination with abnormal findings: Secondary | ICD-10-CM | POA: Diagnosis not present

## 2020-07-31 DIAGNOSIS — Z6833 Body mass index (BMI) 33.0-33.9, adult: Secondary | ICD-10-CM | POA: Diagnosis not present

## 2020-07-31 DIAGNOSIS — Z7189 Other specified counseling: Secondary | ICD-10-CM | POA: Diagnosis not present

## 2020-07-31 DIAGNOSIS — Z1231 Encounter for screening mammogram for malignant neoplasm of breast: Secondary | ICD-10-CM | POA: Diagnosis not present

## 2020-07-31 DIAGNOSIS — E669 Obesity, unspecified: Secondary | ICD-10-CM | POA: Diagnosis not present

## 2020-07-31 DIAGNOSIS — E2839 Other primary ovarian failure: Secondary | ICD-10-CM | POA: Diagnosis not present

## 2020-07-31 DIAGNOSIS — I25119 Atherosclerotic heart disease of native coronary artery with unspecified angina pectoris: Secondary | ICD-10-CM | POA: Diagnosis not present

## 2020-07-31 DIAGNOSIS — E785 Hyperlipidemia, unspecified: Secondary | ICD-10-CM | POA: Diagnosis not present

## 2020-07-31 DIAGNOSIS — D6869 Other thrombophilia: Secondary | ICD-10-CM | POA: Diagnosis not present

## 2020-07-31 DIAGNOSIS — Z1339 Encounter for screening examination for other mental health and behavioral disorders: Secondary | ICD-10-CM | POA: Diagnosis not present

## 2020-07-31 DIAGNOSIS — E039 Hypothyroidism, unspecified: Secondary | ICD-10-CM | POA: Diagnosis not present

## 2020-07-31 DIAGNOSIS — E1169 Type 2 diabetes mellitus with other specified complication: Secondary | ICD-10-CM | POA: Diagnosis not present

## 2020-07-31 DIAGNOSIS — I480 Paroxysmal atrial fibrillation: Secondary | ICD-10-CM | POA: Diagnosis not present

## 2020-07-31 DIAGNOSIS — Z1159 Encounter for screening for other viral diseases: Secondary | ICD-10-CM | POA: Diagnosis not present

## 2020-08-14 NOTE — Progress Notes (Signed)
Remote ICD transmission.   

## 2020-09-03 DIAGNOSIS — L570 Actinic keratosis: Secondary | ICD-10-CM | POA: Diagnosis not present

## 2020-09-03 DIAGNOSIS — D1801 Hemangioma of skin and subcutaneous tissue: Secondary | ICD-10-CM | POA: Diagnosis not present

## 2020-10-08 ENCOUNTER — Ambulatory Visit: Payer: HMO | Admitting: Cardiology

## 2020-10-08 ENCOUNTER — Other Ambulatory Visit: Payer: Self-pay

## 2020-10-08 VITALS — BP 128/74 | HR 60 | Ht 62.5 in | Wt 181.8 lb

## 2020-10-08 DIAGNOSIS — I2510 Atherosclerotic heart disease of native coronary artery without angina pectoris: Secondary | ICD-10-CM | POA: Diagnosis not present

## 2020-10-08 DIAGNOSIS — I255 Ischemic cardiomyopathy: Secondary | ICD-10-CM | POA: Diagnosis not present

## 2020-10-08 DIAGNOSIS — Z95810 Presence of automatic (implantable) cardiac defibrillator: Secondary | ICD-10-CM

## 2020-10-08 DIAGNOSIS — E119 Type 2 diabetes mellitus without complications: Secondary | ICD-10-CM | POA: Diagnosis not present

## 2020-10-08 DIAGNOSIS — E785 Hyperlipidemia, unspecified: Secondary | ICD-10-CM | POA: Diagnosis not present

## 2020-10-08 NOTE — Patient Instructions (Signed)

## 2020-10-08 NOTE — Progress Notes (Signed)
Cardiology Office Note:    Date:  10/08/2020   ID:  Christina Perry, DOB 09-12-1950, MRN [id number]  PCP:  Ernestene Kiel, MD  Cardiologist:  Jenne Campus, MD    Referring MD: Ernestene Kiel, MD   Chief Complaint  Patient presents with   Follow-up  Am doing well  History of Present Illness:    Christina Perry is a 70 y.o. female with past medical history significant for coronary artery disease, status post myocardial infarction in 1996 involving LAD territory which left her with akinesis of the apex and LV thrombus she also does have history of diabetes, essential hypertension, dyslipidemia, Medtronic ICD. She is coming to my office today for follow-up.  Overall she is doing very well.  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  Her husband who is my patient as well had bypass surgery few years ago and now the exercise on the regular basis going to [facility] 3 times a week which I encouraged him to continue.  Past Medical History:  Diagnosis Date   Anxiety    Cardiomyopathy ([place])    Coronary artery disease involving native coronary artery of native heart without angina pectoris 11/02/2014   Overview:  Large anterior myocardial infarction and age of 81   Coronary artery stenosis    Defibrillator discharge 11/02/2014   Formatting of this note might be different from the original. 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   LV (left ventricular) mural thrombus following MI ([place]) 09/27/2017   MI (myocardial infarction) [facility])    Thyroid disease    Type 2 diabetes mellitus without complication ([place]) [id number]    Past Surgical History:  Procedure Laterality Date   BARIATRIC SURGERY  2005   CAROTID STENT     CESAREAN SECTION  1979   CORONARY ANGIOPLASTY     ICD  GENERATOR CHANGEOUT N/A 10/11/2017   Procedure: ICD GENERATOR CHANGEOUT;  Surgeon: Constance Haw, MD;  Location: [facility];  Service: Cardiovascular;  Laterality: N/A;   ICD Placement     TONSILLECTOMY      Current Medications: Current Meds  Medication Sig   atorvastatin (LIPITOR) 20 MG tablet Take 20 mg by mouth daily.   carvedilol (COREG) 12.5 MG tablet Take 1 tablet (12.5 mg total) by mouth 2 (two) times daily.   ELIQUIS 5 MG TABS tablet Take 1 tablet by mouth twice daily (Patient taking differently: Take 5 mg by mouth 2 (two) times daily.)   EUTHYROX 200 MCG tablet Take 200 mcg by mouth every morning.   famotidine (PEPCID) 10 MG tablet Take 10 mg by mouth daily.   furosemide (LASIX) 20 MG tablet Take 40 mg by mouth daily as needed for fluid.   glimepiride (AMARYL) 4 MG tablet Take 4 mg by mouth daily.   Magnesium 500 MG TABS Take 500 mg by mouth 2 (two) times daily. 400-500 mg bid   metFORMIN (GLUCOPHAGE) 1000 MG tablet Take 1,000 mg by mouth 2 (two) times daily.   PARoxetine (PAXIL) 10 MG tablet Take 10 mg by mouth daily.     Allergies:   Bactrim [sulfamethoxazole-trimethoprim], Sulfa antibiotics, Cozaar [losartan], and Lisinopril   Social History   Socioeconomic History   Marital status: Married    Spouse name: Not on file  Number of children: Not on file   Years of education: Not on file   Highest education level: Not on file  Occupational History   Not on file  Tobacco Use   Smoking status: Never   Smokeless tobacco: Never  Vaping Use   Vaping Use: Never used  Substance and Sexual Activity   Alcohol use: Yes    Comment: occ   Drug use: No   Sexual activity: Not on file  Other Topics Concern   Not on file  Social History Narrative   Not on file   Social Determinants of Health   Financial Resource Strain: Not on file  Food Insecurity: Not on file  Transportation Needs: Not on file  Physical Activity: Not on file  Stress: Not on file  Social  Connections: Not on file     Family History: The patient's family history includes CAD in her mother; COPD in her mother; Diabetes in her maternal grandmother and mother; Stroke in her maternal grandmother and mother. ROS:   Please see the history of present illness.    All 14 point review of systems negative except as described per history of present illness  EKGs/Labs/Other Studies Reviewed:      Recent Labs: 05/27/2020: BUN 30; Creatinine, Ser 1.24; Potassium 4.4; Sodium 138  Recent Lipid Panel No results found for: CHOL, TRIG, HDL, CHOLHDL, VLDL, LDLCALC, LDLDIRECT  Physical Exam:    VS:  BP 128/74 (BP Location: Left Arm, Patient Position: Sitting)   Pulse 60   Ht 5' 2.5" (1.588 m)   Wt 181 lb 12.8 oz (82.5 kg)   SpO2 98%   BMI 32.72 kg/m     Wt Readings from Last 3 Encounters:  10/08/20 181 lb 12.8 oz (82.5 kg)  04/10/20 185 lb (83.9 kg)  11/08/19 189 lb (85.7 kg)     GEN:  Well nourished, well developed in no acute distress HEENT: Normal NECK: No JVD; No carotid bruits LYMPHATICS: No lymphadenopathy CARDIAC: RRR, no murmurs, no rubs, no gallops RESPIRATORY:  Clear to auscultation without rales, wheezing or rhonchi  ABDOMEN: Soft, non-tender, non-distended MUSCULOSKELETAL:  No edema; No deformity  SKIN: Warm and dry LOWER EXTREMITIES: no swelling NEUROLOGIC:  Alert and oriented x 3 PSYCHIATRIC:  Normal affect   ASSESSMENT:    1. Coronary artery disease involving native coronary artery of native heart without angina pectoris   2. Ischemic cardiomyopathy   3. ICD (implantable cardioverter-defibrillator) in place   4. Dyslipidemia   5. Diabetes mellitus without complication (HCC)    PLAN:    In order of problems listed above:  Coronary artery disease stable from that point review we will continue present management Ischemic cardiomyopathy diminished ejection fraction intolerant to ACE inhibitor or ARB Entresto.  However overall hemodynamically compensated  without significant changes in left ventricle ejection fraction, therefore, we will continue present management. ICD present that is a Medtronic device4.7 years left in the battery.  No significant arrhythmia identified.  Patient activities about 3 to 4 hours of her day. Dyslipidemia I did review K PN which show me her LDL of 67 HDL 63 this is from 07/31/2020 and she is on Lipitor 20 which I will continue Diabetes well controlled hemoglobin A1c from 615 2026.7.  Continue present management ICD present I did review interrogation of the device   Medication Adjustments/Labs and Tests Ordered: Current medicines are reviewed at length with the patient today.  Concerns regarding medicines are outlined above.  No orders of the defined  types were placed in this encounter.  Medication changes: No orders of the defined types were placed in this encounter.   Signed, Park Liter, MD, [facility] 10/08/2020 2:35 PM    [place]

## 2020-10-22 ENCOUNTER — Ambulatory Visit (INDEPENDENT_AMBULATORY_CARE_PROVIDER_SITE_OTHER): Payer: HMO

## 2020-10-22 DIAGNOSIS — I255 Ischemic cardiomyopathy: Secondary | ICD-10-CM

## 2020-10-22 LAB — CUP PACEART REMOTE DEVICE CHECK
Battery Remaining Longevity: 50 mo
Battery Voltage: 2.92 V
Brady Statistic AP VP Percent: 0 %
Brady Statistic AP VS Percent: 41.73 %
Brady Statistic AS VP Percent: 0 %
Brady Statistic AS VS Percent: 58.27 %
Brady Statistic RA Percent Paced: 40.93 %
Brady Statistic RV Percent Paced: 0 %
Date Time Interrogation Session: 20220906033322
HighPow Impedance: 46 Ohm
HighPow Impedance: 59 Ohm
Implantable Lead Implant Date: 20111212
Implantable Lead Implant Date: 20111212
Implantable Lead Location: 753859
Implantable Lead Location: 753860
Implantable Lead Model: 5076
Implantable Lead Model: 6947
Implantable Pulse Generator Implant Date: 20190826
Lead Channel Impedance Value: 304 Ohm
Lead Channel Impedance Value: 399 Ohm
Lead Channel Impedance Value: 418 Ohm
Lead Channel Pacing Threshold Amplitude: 1.5 V
Lead Channel Pacing Threshold Pulse Width: 0.4 ms
Lead Channel Sensing Intrinsic Amplitude: 1 mV
Lead Channel Sensing Intrinsic Amplitude: 1 mV
Lead Channel Sensing Intrinsic Amplitude: 6.375 mV
Lead Channel Sensing Intrinsic Amplitude: 6.375 mV
Lead Channel Setting Pacing Amplitude: 2.5 V
Lead Channel Setting Pacing Amplitude: 5 V
Lead Channel Setting Pacing Pulse Width: 1 ms
Lead Channel Setting Sensing Sensitivity: 0.3 mV

## 2020-10-30 DIAGNOSIS — M85851 Other specified disorders of bone density and structure, right thigh: Secondary | ICD-10-CM | POA: Diagnosis not present

## 2020-10-30 DIAGNOSIS — M85831 Other specified disorders of bone density and structure, right forearm: Secondary | ICD-10-CM | POA: Diagnosis not present

## 2020-10-30 DIAGNOSIS — E2839 Other primary ovarian failure: Secondary | ICD-10-CM | POA: Diagnosis not present

## 2020-10-30 DIAGNOSIS — Z1231 Encounter for screening mammogram for malignant neoplasm of breast: Secondary | ICD-10-CM | POA: Diagnosis not present

## 2020-10-31 NOTE — Progress Notes (Signed)
Remote ICD transmission.   

## 2020-11-13 DIAGNOSIS — E039 Hypothyroidism, unspecified: Secondary | ICD-10-CM | POA: Diagnosis not present

## 2020-11-21 DIAGNOSIS — H6121 Impacted cerumen, right ear: Secondary | ICD-10-CM | POA: Diagnosis not present

## 2020-11-21 DIAGNOSIS — H9190 Unspecified hearing loss, unspecified ear: Secondary | ICD-10-CM | POA: Diagnosis not present

## 2020-11-21 DIAGNOSIS — H903 Sensorineural hearing loss, bilateral: Secondary | ICD-10-CM | POA: Diagnosis not present

## 2020-11-25 DIAGNOSIS — N390 Urinary tract infection, site not specified: Secondary | ICD-10-CM | POA: Diagnosis not present

## 2020-12-16 DIAGNOSIS — R051 Acute cough: Secondary | ICD-10-CM | POA: Diagnosis not present

## 2021-01-21 ENCOUNTER — Ambulatory Visit (INDEPENDENT_AMBULATORY_CARE_PROVIDER_SITE_OTHER): Payer: HMO

## 2021-01-21 DIAGNOSIS — I255 Ischemic cardiomyopathy: Secondary | ICD-10-CM

## 2021-01-21 LAB — CUP PACEART REMOTE DEVICE CHECK
Battery Remaining Longevity: 43 mo
Battery Voltage: 2.9 V
Brady Statistic AP VP Percent: 0 %
Brady Statistic AP VS Percent: 40.66 %
Brady Statistic AS VP Percent: 0 %
Brady Statistic AS VS Percent: 59.34 %
Brady Statistic RA Percent Paced: 39.81 %
Brady Statistic RV Percent Paced: 0 %
Date Time Interrogation Session: 20221206033324
HighPow Impedance: 44 Ohm
HighPow Impedance: 55 Ohm
Implantable Lead Implant Date: 20111212
Implantable Lead Implant Date: 20111212
Implantable Lead Location: 753859
Implantable Lead Location: 753860
Implantable Lead Model: 5076
Implantable Lead Model: 6947
Implantable Pulse Generator Implant Date: 20190826
Lead Channel Impedance Value: 304 Ohm
Lead Channel Impedance Value: 361 Ohm
Lead Channel Impedance Value: 399 Ohm
Lead Channel Pacing Threshold Amplitude: 1.5 V
Lead Channel Pacing Threshold Pulse Width: 0.4 ms
Lead Channel Sensing Intrinsic Amplitude: 0.875 mV
Lead Channel Sensing Intrinsic Amplitude: 0.875 mV
Lead Channel Sensing Intrinsic Amplitude: 6.125 mV
Lead Channel Sensing Intrinsic Amplitude: 6.125 mV
Lead Channel Setting Pacing Amplitude: 2.5 V
Lead Channel Setting Pacing Amplitude: 5 V
Lead Channel Setting Pacing Pulse Width: 1 ms
Lead Channel Setting Sensing Sensitivity: 0.3 mV

## 2021-01-30 NOTE — Progress Notes (Signed)
Remote ICD transmission.   

## 2021-04-07 DIAGNOSIS — I10 Essential (primary) hypertension: Secondary | ICD-10-CM | POA: Diagnosis not present

## 2021-04-07 DIAGNOSIS — K219 Gastro-esophageal reflux disease without esophagitis: Secondary | ICD-10-CM | POA: Diagnosis not present

## 2021-04-07 DIAGNOSIS — D6869 Other thrombophilia: Secondary | ICD-10-CM | POA: Diagnosis not present

## 2021-04-07 DIAGNOSIS — R053 Chronic cough: Secondary | ICD-10-CM | POA: Diagnosis not present

## 2021-04-07 DIAGNOSIS — I480 Paroxysmal atrial fibrillation: Secondary | ICD-10-CM | POA: Diagnosis not present

## 2021-04-07 DIAGNOSIS — Z79899 Other long term (current) drug therapy: Secondary | ICD-10-CM | POA: Diagnosis not present

## 2021-04-07 DIAGNOSIS — I255 Ischemic cardiomyopathy: Secondary | ICD-10-CM | POA: Diagnosis not present

## 2021-04-07 DIAGNOSIS — E1169 Type 2 diabetes mellitus with other specified complication: Secondary | ICD-10-CM | POA: Diagnosis not present

## 2021-04-07 DIAGNOSIS — Z6835 Body mass index (BMI) 35.0-35.9, adult: Secondary | ICD-10-CM | POA: Diagnosis not present

## 2021-04-07 DIAGNOSIS — E785 Hyperlipidemia, unspecified: Secondary | ICD-10-CM | POA: Diagnosis not present

## 2021-04-07 DIAGNOSIS — I25119 Atherosclerotic heart disease of native coronary artery with unspecified angina pectoris: Secondary | ICD-10-CM | POA: Diagnosis not present

## 2021-04-07 DIAGNOSIS — E039 Hypothyroidism, unspecified: Secondary | ICD-10-CM | POA: Diagnosis not present

## 2021-04-15 ENCOUNTER — Ambulatory Visit: Payer: HMO | Admitting: Cardiology

## 2021-04-15 ENCOUNTER — Other Ambulatory Visit: Payer: Self-pay

## 2021-04-15 ENCOUNTER — Encounter: Payer: Self-pay | Admitting: Cardiology

## 2021-04-15 VITALS — BP 114/78 | HR 74 | Ht 61.5 in | Wt 183.2 lb

## 2021-04-15 DIAGNOSIS — I2510 Atherosclerotic heart disease of native coronary artery without angina pectoris: Secondary | ICD-10-CM | POA: Diagnosis not present

## 2021-04-15 DIAGNOSIS — E785 Hyperlipidemia, unspecified: Secondary | ICD-10-CM

## 2021-04-15 DIAGNOSIS — Z95810 Presence of automatic (implantable) cardiac defibrillator: Secondary | ICD-10-CM | POA: Diagnosis not present

## 2021-04-15 DIAGNOSIS — Z794 Long term (current) use of insulin: Secondary | ICD-10-CM

## 2021-04-15 DIAGNOSIS — E119 Type 2 diabetes mellitus without complications: Secondary | ICD-10-CM

## 2021-04-15 DIAGNOSIS — I255 Ischemic cardiomyopathy: Secondary | ICD-10-CM

## 2021-04-15 NOTE — Progress Notes (Signed)
Cardiology Office Note:    Date:  04/15/2021   ID:  Christina Perry, DOB 23-Mar-1950, MRN [id number]  PCP:  Ernestene Kiel, MD  Cardiologist:  Jenne Campus, MD    Referring MD: Ernestene Kiel, MD   Chief Complaint  Patient presents with   Follow-up  Doing well  History of Present Illness:    Christina Perry is a 71 y.o. female with past medical history significant for coronary artery disease, status post anterior wall myocardial infarction 1996 she does have akinesis of that area with akinesis of apex.  She also had history of diabetes, essential hypertension, dyslipidemia.  LV thrombus.  Medtronic ICD. She comes today 2 months for follow-up.  Overall she is doing very well.  She denies have any chest pain, tightness, pressure, burning in the chest.  Still works part-time as a Marine scientist.  Have no difficulty doing it.  Past Medical History:  Diagnosis Date   Anxiety    Cardiomyopathy ([place])    Coronary artery disease involving native coronary artery of native heart without angina pectoris 11/02/2014   Overview:  Large anterior myocardial infarction and age of 34   Coronary artery stenosis    Defibrillator discharge 11/02/2014   Formatting of this note might be different from the original. Medtronic   Depression    Diabetes mellitus without complication ([place])    Dyslipidemia 11/02/2014   Hyperlipidemia    Hypertension    ICD (implantable cardioverter-defibrillator) in place 05/18/2016   LV (left ventricular) mural thrombus following MI ([place]) 09/27/2017   MI [facility]ocardial infarction) [facility])    Thyroid disease    Type 2 diabetes mellitus without complication ([place]) 5/32/9924    Past Surgical History:  Procedure Laterality Date   BARIATRIC SURGERY  2005   CAROTID STENT     CESAREAN SECTION  1979   CORONARY ANGIOPLASTY     ICD GENERATOR CHANGEOUT N/A 10/11/2017   Procedure: ICD GENERATOR CHANGEOUT;  Surgeon: Constance Haw, MD;  Location: [facility];  Service:  Cardiovascular;  Laterality: N/A;   ICD Placement     TONSILLECTOMY      Current Medications: Current Meds  Medication Sig   atorvastatin (LIPITOR) 20 MG tablet Take 20 mg by mouth daily.   carvedilol (COREG) 12.5 MG tablet Take 1 tablet (12.5 mg total) by mouth 2 (two) times daily.   ELIQUIS 5 MG TABS tablet Take 1 tablet by mouth twice daily (Patient taking differently: Take 5 mg by mouth 2 (two) times daily.)   EUTHYROX 200 MCG tablet Take 200 mcg by mouth every morning.   famotidine (PEPCID) 10 MG tablet Take 10 mg by mouth daily.   furosemide (LASIX) 20 MG tablet Take 40 mg by mouth daily as needed for fluid.   glimepiride (AMARYL) 4 MG tablet Take 4 mg by mouth daily.   Magnesium 500 MG TABS Take 500 mg by mouth 2 (two) times daily. 400-500 mg bid   metFORMIN (GLUCOPHAGE) 1000 MG tablet Take 1,000 mg by mouth 2 (two) times daily.   PARoxetine (PAXIL) 10 MG tablet Take 10 mg by mouth daily.     Allergies:   Bactrim [sulfamethoxazole-trimethoprim], Sulfa antibiotics, Cozaar [losartan], and Lisinopril   Social History   Socioeconomic History   Marital status: Married    Spouse name: Not on file   Number of children: Not on file   Years of education: Not on file   Highest education level: Not on file  Occupational History   Not  on file  Tobacco Use   Smoking status: Never   Smokeless tobacco: Never  Vaping Use   Vaping Use: Never used  Substance and Sexual Activity   Alcohol use: Yes    Comment: occ   Drug use: No   Sexual activity: Not on file  Other Topics Concern   Not on file  Social History Narrative   Not on file   Social Determinants of Health   Financial Resource Strain: Not on file  Food Insecurity: Not on file  Transportation Needs: Not on file  Physical Activity: Not on file  Stress: Not on file  Social Connections: Not on file     Family History: The patient's family history includes CAD in her mother; COPD in her mother; Diabetes in her  maternal grandmother and mother; Stroke in her maternal grandmother and mother. ROS:   Please see the history of present illness.    All 14 point review of systems negative except as described per history of present illness  EKGs/Labs/Other Studies Reviewed:      Recent Labs: 05/27/2020: BUN 30; Creatinine, Ser 1.24; Potassium 4.4; Sodium 138  Recent Lipid Panel No results found for: CHOL, TRIG, HDL, CHOLHDL, VLDL, LDLCALC, LDLDIRECT  Physical Exam:    VS:  BP 114/78 (BP Location: Right Arm, Patient Position: Sitting)    Pulse 74    Ht 5' 1.5" (1.562 m)    Wt 183 lb 3.2 oz (83.1 kg)    SpO2 96%    BMI 34.05 kg/m     Wt Readings from Last 3 Encounters:  04/15/21 183 lb 3.2 oz (83.1 kg)  10/08/20 181 lb 12.8 oz (82.5 kg)  04/10/20 185 lb (83.9 kg)     GEN:  Well nourished, well developed in no acute distress HEENT: Normal NECK: No JVD; No carotid bruits LYMPHATICS: No lymphadenopathy CARDIAC: RRR, no murmurs, no rubs, no gallops RESPIRATORY:  Clear to auscultation without rales, wheezing or rhonchi  ABDOMEN: Soft, non-tender, non-distended MUSCULOSKELETAL:  No edema; No deformity  SKIN: Warm and dry LOWER EXTREMITIES: no swelling NEUROLOGIC:  Alert and oriented x 3 PSYCHIATRIC:  Normal affect   ASSESSMENT:    1. Ischemic cardiomyopathy   2. Coronary artery disease involving native coronary artery of native heart without angina pectoris   3. Type 2 diabetes mellitus without complication, with long-term current use of insulin ([place])   4. Dyslipidemia   5. ICD (implantable cardioverter-defibrillator) in place    PLAN:    In order of problems listed above:  Ischemic cardiomyopathy she is intolerant to ACE inhibitor or ARB Entresto her likely her ejection fraction remains stable.  I will ask her to have another echocardiogram to recheck on that. Dyslipidemia I did review her K PN which show me her LDL of 67 HDL 63 she is on Lipitor 20 which I will continue. Diabetes  mellitus.  That being followed by internal medicine team I did review K PN which show me her hemoglobin A1c of 6.7 from 07/31/2020 good cholesterol profile. ICD present is a Medtronic device I did review interrogation no detection of the shortest in detail with the expected 3.6 years   Medication Adjustments/Labs and Tests Ordered: Current medicines are reviewed at length with the patient today.  Concerns regarding medicines are outlined above.  No orders of the defined types were placed in this encounter.  Medication changes: No orders of the defined types were placed in this encounter.   Signed, Park Liter, MD, [facility] 04/15/2021  9:10 AM    [facility]

## 2021-04-15 NOTE — Patient Instructions (Signed)

## 2021-04-22 ENCOUNTER — Ambulatory Visit (INDEPENDENT_AMBULATORY_CARE_PROVIDER_SITE_OTHER): Payer: HMO

## 2021-04-22 DIAGNOSIS — I255 Ischemic cardiomyopathy: Secondary | ICD-10-CM | POA: Diagnosis not present

## 2021-04-22 LAB — CUP PACEART REMOTE DEVICE CHECK
Battery Remaining Longevity: 39 mo
Battery Voltage: 2.89 V
Brady Statistic AP VP Percent: 0 %
Brady Statistic AP VS Percent: 39.73 %
Brady Statistic AS VP Percent: 0 %
Brady Statistic AS VS Percent: 60.27 %
Brady Statistic RA Percent Paced: 39.31 %
Brady Statistic RV Percent Paced: 0 %
Date Time Interrogation Session: 20230307001603
HighPow Impedance: 53 Ohm
HighPow Impedance: 68 Ohm
Implantable Lead Implant Date: 20111212
Implantable Lead Implant Date: 20111212
Implantable Lead Location: 753859
Implantable Lead Location: 753860
Implantable Lead Model: 5076
Implantable Lead Model: 6947
Implantable Pulse Generator Implant Date: 20190826
Lead Channel Impedance Value: 304 Ohm
Lead Channel Impedance Value: 399 Ohm
Lead Channel Impedance Value: 399 Ohm
Lead Channel Pacing Threshold Amplitude: 1.5 V
Lead Channel Pacing Threshold Pulse Width: 0.4 ms
Lead Channel Sensing Intrinsic Amplitude: 1.25 mV
Lead Channel Sensing Intrinsic Amplitude: 1.25 mV
Lead Channel Sensing Intrinsic Amplitude: 7.375 mV
Lead Channel Sensing Intrinsic Amplitude: 7.375 mV
Lead Channel Setting Pacing Amplitude: 2.5 V
Lead Channel Setting Pacing Amplitude: 5 V
Lead Channel Setting Pacing Pulse Width: 1 ms
Lead Channel Setting Sensing Sensitivity: 0.3 mV

## 2021-04-23 ENCOUNTER — Ambulatory Visit (INDEPENDENT_AMBULATORY_CARE_PROVIDER_SITE_OTHER): Payer: HMO

## 2021-04-23 ENCOUNTER — Other Ambulatory Visit: Payer: Self-pay

## 2021-04-23 DIAGNOSIS — E785 Hyperlipidemia, unspecified: Secondary | ICD-10-CM

## 2021-04-23 DIAGNOSIS — E119 Type 2 diabetes mellitus without complications: Secondary | ICD-10-CM

## 2021-04-23 DIAGNOSIS — I2510 Atherosclerotic heart disease of native coronary artery without angina pectoris: Secondary | ICD-10-CM | POA: Diagnosis not present

## 2021-04-23 DIAGNOSIS — Z95810 Presence of automatic (implantable) cardiac defibrillator: Secondary | ICD-10-CM

## 2021-04-23 DIAGNOSIS — I255 Ischemic cardiomyopathy: Secondary | ICD-10-CM

## 2021-04-23 DIAGNOSIS — Z794 Long term (current) use of insulin: Secondary | ICD-10-CM

## 2021-04-23 LAB — ECHOCARDIOGRAM COMPLETE
Area-P 1/2: 3.12 cm2
Calc EF: 44.5 %
S' Lateral: 3.9 cm
Single Plane A2C EF: 46.1 %
Single Plane A4C EF: 42.5 %

## 2021-04-23 MED ORDER — PERFLUTREN LIPID MICROSPHERE
1.0000 mL | INTRAVENOUS | Status: AC | PRN
  Administered 2021-04-23: 7 mL via INTRAVENOUS

## 2021-05-01 ENCOUNTER — Telehealth: Payer: Self-pay

## 2021-05-01 ENCOUNTER — Telehealth: Payer: Self-pay | Admitting: Cardiology

## 2021-05-01 NOTE — Telephone Encounter (Signed)
Patient notified of results.

## 2021-05-01 NOTE — Telephone Encounter (Signed)
Patient called for her Echo results.  

## 2021-05-01 NOTE — Telephone Encounter (Signed)
-----   Message from Park Liter, MD sent at 04/24/2021  8:15 AM EST ----- ?Echocardiogram showed ejection fraction 35 to 40%.  Evidence of old microinfarction, continue present management ?

## 2021-05-01 NOTE — Telephone Encounter (Signed)
LM to return my call. 

## 2021-05-02 NOTE — Telephone Encounter (Signed)
Left message for patient to call back  

## 2021-05-05 NOTE — Progress Notes (Signed)
Remote ICD transmission.   

## 2021-05-09 NOTE — Telephone Encounter (Signed)
Patient informed of results on 3/16. ?

## 2021-07-21 DIAGNOSIS — T162XXA Foreign body in left ear, initial encounter: Secondary | ICD-10-CM | POA: Diagnosis not present

## 2021-07-22 ENCOUNTER — Ambulatory Visit (INDEPENDENT_AMBULATORY_CARE_PROVIDER_SITE_OTHER): Payer: HMO

## 2021-07-22 DIAGNOSIS — I255 Ischemic cardiomyopathy: Secondary | ICD-10-CM | POA: Diagnosis not present

## 2021-07-22 LAB — CUP PACEART REMOTE DEVICE CHECK
Battery Remaining Longevity: 35 mo
Battery Voltage: 2.96 V
Brady Statistic AP VP Percent: 0 %
Brady Statistic AP VS Percent: 47.21 %
Brady Statistic AS VP Percent: 0 %
Brady Statistic AS VS Percent: 52.79 %
Brady Statistic RA Percent Paced: 46.94 %
Brady Statistic RV Percent Paced: 0 %
Date Time Interrogation Session: 20230606033323
HighPow Impedance: 48 Ohm
HighPow Impedance: 59 Ohm
Implantable Lead Implant Date: 20111212
Implantable Lead Implant Date: 20111212
Implantable Lead Location: 753859
Implantable Lead Location: 753860
Implantable Lead Model: 5076
Implantable Lead Model: 6947
Implantable Pulse Generator Implant Date: 20190826
Lead Channel Impedance Value: 304 Ohm
Lead Channel Impedance Value: 399 Ohm
Lead Channel Impedance Value: 418 Ohm
Lead Channel Pacing Threshold Amplitude: 1.5 V
Lead Channel Pacing Threshold Pulse Width: 0.4 ms
Lead Channel Sensing Intrinsic Amplitude: 1 mV
Lead Channel Sensing Intrinsic Amplitude: 1 mV
Lead Channel Sensing Intrinsic Amplitude: 6.625 mV
Lead Channel Sensing Intrinsic Amplitude: 6.625 mV
Lead Channel Setting Pacing Amplitude: 2.5 V
Lead Channel Setting Pacing Amplitude: 5 V
Lead Channel Setting Pacing Pulse Width: 1 ms
Lead Channel Setting Sensing Sensitivity: 0.3 mV

## 2021-08-05 NOTE — Progress Notes (Signed)
Remote ICD transmission.   

## 2021-08-06 DIAGNOSIS — Z7189 Other specified counseling: Secondary | ICD-10-CM | POA: Diagnosis not present

## 2021-08-06 DIAGNOSIS — R053 Chronic cough: Secondary | ICD-10-CM | POA: Diagnosis not present

## 2021-08-06 DIAGNOSIS — Z1331 Encounter for screening for depression: Secondary | ICD-10-CM | POA: Diagnosis not present

## 2021-08-06 DIAGNOSIS — Z79899 Other long term (current) drug therapy: Secondary | ICD-10-CM | POA: Diagnosis not present

## 2021-08-06 DIAGNOSIS — E785 Hyperlipidemia, unspecified: Secondary | ICD-10-CM | POA: Diagnosis not present

## 2021-08-06 DIAGNOSIS — E1169 Type 2 diabetes mellitus with other specified complication: Secondary | ICD-10-CM | POA: Diagnosis not present

## 2021-08-06 DIAGNOSIS — Z6835 Body mass index (BMI) 35.0-35.9, adult: Secondary | ICD-10-CM | POA: Diagnosis not present

## 2021-08-06 DIAGNOSIS — I10 Essential (primary) hypertension: Secondary | ICD-10-CM | POA: Diagnosis not present

## 2021-08-06 DIAGNOSIS — E039 Hypothyroidism, unspecified: Secondary | ICD-10-CM | POA: Diagnosis not present

## 2021-08-06 DIAGNOSIS — Z0000 Encounter for general adult medical examination without abnormal findings: Secondary | ICD-10-CM | POA: Diagnosis not present

## 2021-08-12 DIAGNOSIS — R053 Chronic cough: Secondary | ICD-10-CM | POA: Diagnosis not present

## 2021-09-09 DIAGNOSIS — Z6834 Body mass index (BMI) 34.0-34.9, adult: Secondary | ICD-10-CM | POA: Diagnosis not present

## 2021-09-09 DIAGNOSIS — J45991 Cough variant asthma: Secondary | ICD-10-CM | POA: Diagnosis not present

## 2021-09-23 DIAGNOSIS — D225 Melanocytic nevi of trunk: Secondary | ICD-10-CM | POA: Diagnosis not present

## 2021-09-23 DIAGNOSIS — D485 Neoplasm of uncertain behavior of skin: Secondary | ICD-10-CM | POA: Diagnosis not present

## 2021-09-23 DIAGNOSIS — L814 Other melanin hyperpigmentation: Secondary | ICD-10-CM | POA: Diagnosis not present

## 2021-09-23 DIAGNOSIS — L821 Other seborrheic keratosis: Secondary | ICD-10-CM | POA: Diagnosis not present

## 2021-09-23 DIAGNOSIS — D2239 Melanocytic nevi of other parts of face: Secondary | ICD-10-CM | POA: Diagnosis not present

## 2021-10-07 ENCOUNTER — Encounter: Payer: Self-pay | Admitting: Cardiology

## 2021-10-07 ENCOUNTER — Ambulatory Visit: Payer: HMO | Admitting: Cardiology

## 2021-10-07 VITALS — BP 104/64 | HR 60 | Ht 62.0 in | Wt 188.2 lb

## 2021-10-07 DIAGNOSIS — J45991 Cough variant asthma: Secondary | ICD-10-CM | POA: Diagnosis not present

## 2021-10-07 DIAGNOSIS — I2510 Atherosclerotic heart disease of native coronary artery without angina pectoris: Secondary | ICD-10-CM | POA: Diagnosis not present

## 2021-10-07 DIAGNOSIS — I255 Ischemic cardiomyopathy: Secondary | ICD-10-CM | POA: Diagnosis not present

## 2021-10-07 DIAGNOSIS — E119 Type 2 diabetes mellitus without complications: Secondary | ICD-10-CM

## 2021-10-07 DIAGNOSIS — E785 Hyperlipidemia, unspecified: Secondary | ICD-10-CM

## 2021-10-07 DIAGNOSIS — Z95810 Presence of automatic (implantable) cardiac defibrillator: Secondary | ICD-10-CM

## 2021-10-07 DIAGNOSIS — I10 Essential (primary) hypertension: Secondary | ICD-10-CM | POA: Diagnosis not present

## 2021-10-07 DIAGNOSIS — Z794 Long term (current) use of insulin: Secondary | ICD-10-CM | POA: Diagnosis not present

## 2021-10-07 NOTE — Progress Notes (Unsigned)
Cardiology Office Note:    Date:  10/07/2021   ID:  Christina Perry, DOB 05-02-1950, MRN [id number]  PCP:  Ernestene Kiel, MD  Cardiologist:  Jenne Campus, MD    Referring MD: Ernestene Kiel, MD   Chief Complaint  Patient presents with   Follow-up  Doing well  History of Present Illness:    Christina Perry is a 71 y.o. female with past medical history significant for coronary artery disease, status post anterior wall myocardial infarction 1996.  She still get akinesis of the area with akinesis of the apex.  Also history of diabetes, essential hypertension, dyslipidemia, LV thrombus, Medtronic ICD. She is in my office for follow-up.  Overall she is doing very well.  She denies have any chest pain tightness squeezing pressure burning chest.  She works as a Marine scientist but slows down significantly.  She works only few days a month.  She is planning to go to [place] in December.  She is looking forward to it.  Cardiac wise no complaints  Past Medical History:  Diagnosis Date   Anxiety    Cardiomyopathy ([place])    Coronary artery disease involving native coronary artery of native heart without angina pectoris 11/02/2014   Overview:  Large anterior myocardial infarction and age of 22   Coronary artery stenosis    Defibrillator discharge 11/02/2014   Formatting of this note might be different from the original. Medtronic   Depression    Diabetes mellitus without complication ([place])    Dyslipidemia 11/02/2014   Hyperlipidemia    Hypertension    ICD (implantable cardioverter-defibrillator) in place 05/18/2016   LV (left ventricular) mural thrombus following MI ([place]) 09/27/2017   MI (myocardial infarction) [facility])    Thyroid disease    Type 2 diabetes mellitus without complication ([place]) 1/95/0932    Past Surgical History:  Procedure Laterality Date   BARIATRIC SURGERY  2005   CAROTID STENT     CESAREAN SECTION  1979   CORONARY ANGIOPLASTY     ICD GENERATOR CHANGEOUT N/A 10/11/2017    Procedure: ICD GENERATOR CHANGEOUT;  Surgeon: Constance Haw, MD;  Location: [facility];  Service: Cardiovascular;  Laterality: N/A;   ICD Placement     TONSILLECTOMY      Current Medications: Current Meds  Medication Sig   atorvastatin (LIPITOR) 20 MG tablet Take 20 mg by mouth daily.   carvedilol (COREG) 12.5 MG tablet Take 1 tablet (12.5 mg total) by mouth 2 (two) times daily.   ELIQUIS 5 MG TABS tablet Take 1 tablet by mouth twice daily (Patient taking differently: Take 5 mg by mouth 2 (two) times daily.)   EUTHYROX 200 MCG tablet Take 200 mcg by mouth every morning. Monday throught  Friday's   famotidine (PEPCID) 10 MG tablet Take 10 mg by mouth daily.   Fluticasone Furoate-Vilanterol (BREO ELLIPTA IN) Inhale 1 puff into the lungs in the morning.   furosemide (LASIX) 20 MG tablet Take 20 mg by mouth daily.   glimepiride (AMARYL) 4 MG tablet Take 4 mg by mouth daily.   Magnesium 500 MG TABS Take 500 mg by mouth 2 (two) times daily. 400-500 mg bid   metFORMIN (GLUCOPHAGE) 1000 MG tablet Take 1,000 mg by mouth 2 (two) times daily.   PARoxetine (PAXIL) 10 MG tablet Take 10 mg by mouth daily.     Allergies:   Bactrim [sulfamethoxazole-trimethoprim], Sulfa antibiotics, Cozaar [losartan], and Lisinopril   Social History   Socioeconomic History   Marital  status: Married    Spouse name: Not on file   Number of children: Not on file   Years of education: Not on file   Highest education level: Not on file  Occupational History   Not on file  Tobacco Use   Smoking status: Never   Smokeless tobacco: Never  Vaping Use   Vaping Use: Never used  Substance and Sexual Activity   Alcohol use: Yes    Comment: occ   Drug use: No   Sexual activity: Not on file  Other Topics Concern   Not on file  Social History Narrative   Not on file   Social Determinants of Health   Financial Resource Strain: Not on file  Food Insecurity: Not on file  Transportation Needs: Not on  file  Physical Activity: Not on file  Stress: Not on file  Social Connections: Not on file     Family History: The patient's family history includes CAD in her mother; COPD in her mother; Diabetes in her maternal grandmother and mother; Stroke in her maternal grandmother and mother. ROS:   Please see the history of present illness.    All 14 point review of systems negative except as described per history of present illness  EKGs/Labs/Other Studies Reviewed:      Recent Labs: No results found for requested labs within last 365 days.  Recent Lipid Panel No results found for: "CHOL", "TRIG", "HDL", "CHOLHDL", "VLDL", "LDLCALC", "LDLDIRECT"  Physical Exam:    VS:  BP 104/64 (BP Location: Left Arm)   Pulse 60   Ht '5\' 2"'$  (1.575 m)   Wt 188 lb 3.2 oz (85.4 kg)   SpO2 95%   BMI 34.42 kg/m     Wt Readings from Last 3 Encounters:  10/07/21 188 lb 3.2 oz (85.4 kg)  04/15/21 183 lb 3.2 oz (83.1 kg)  10/08/20 181 lb 12.8 oz (82.5 kg)     GEN:  Well nourished, well developed in no acute distress HEENT: Normal NECK: No JVD; No carotid bruits LYMPHATICS: No lymphadenopathy CARDIAC: RRR, no murmurs, no rubs, no gallops RESPIRATORY:  Clear to auscultation without rales, wheezing or rhonchi  ABDOMEN: Soft, non-tender, non-distended MUSCULOSKELETAL:  No edema; No deformity  SKIN: Warm and dry LOWER EXTREMITIES: no swelling NEUROLOGIC:  Alert and oriented x 3 PSYCHIATRIC:  Normal affect   ASSESSMENT:    1. Ischemic cardiomyopathy   2. Cough variant asthma   3. Coronary artery disease involving native coronary artery of native heart without angina pectoris   4. Primary hypertension   5. Type 2 diabetes mellitus without complication, with long-term current use of insulin ([place])   6. ICD (implantable cardioverter-defibrillator) in place   7. Dyslipidemia    PLAN:    In order of problems listed above:  Coronary artery disease doing well from that point of view, she is not  antiplatelets therapy because she is taking Eliquis anticoagulation and there was no recent coronary event within the last year. Ischemic cardiomyopathy she is on guideline directed medical therapy however she does have difficulty tolerating medication list why she is on minimal dosages of this medication but overall ejection fraction seems to be maintained, Essential hypertension blood pressure well controlled continue present management. Dyslipidemia, I did review her K PN which show me her LDL of 63 HDL 65.  She is on Lipitor 20 which I will continue LV thrombus: Anticoagulated ICD present: Last interrogation done in July 22, 2021, 2.9 years left in the device, OptiVol  stable, normal function   Medication Adjustments/Labs and Tests Ordered: Current medicines are reviewed at length with the patient today.  Concerns regarding medicines are outlined above.  No orders of the defined types were placed in this encounter.  Medication changes: No orders of the defined types were placed in this encounter.   Signed, Park Liter, MD, [facility] 10/07/2021 10:29 AM    [place]

## 2021-10-07 NOTE — Patient Instructions (Signed)

## 2021-10-21 ENCOUNTER — Ambulatory Visit (INDEPENDENT_AMBULATORY_CARE_PROVIDER_SITE_OTHER): Payer: HMO

## 2021-10-21 DIAGNOSIS — I255 Ischemic cardiomyopathy: Secondary | ICD-10-CM | POA: Diagnosis not present

## 2021-10-28 LAB — CUP PACEART REMOTE DEVICE CHECK
Battery Remaining Longevity: 31 mo
Battery Voltage: 2.95 V
Brady Statistic AP VP Percent: 0 %
Brady Statistic AP VS Percent: 44.69 %
Brady Statistic AS VP Percent: 0 %
Brady Statistic AS VS Percent: 55.31 %
Brady Statistic RA Percent Paced: 44.43 %
Brady Statistic RV Percent Paced: 0 %
Date Time Interrogation Session: 20230905001804
HighPow Impedance: 45 Ohm
HighPow Impedance: 58 Ohm
Implantable Lead Implant Date: 20111212
Implantable Lead Implant Date: 20111212
Implantable Lead Location: 753859
Implantable Lead Location: 753860
Implantable Lead Model: 5076
Implantable Lead Model: 6947
Implantable Pulse Generator Implant Date: 20190826
Lead Channel Impedance Value: 304 Ohm
Lead Channel Impedance Value: 399 Ohm
Lead Channel Impedance Value: 399 Ohm
Lead Channel Pacing Threshold Amplitude: 1.5 V
Lead Channel Pacing Threshold Pulse Width: 0.4 ms
Lead Channel Sensing Intrinsic Amplitude: 1 mV
Lead Channel Sensing Intrinsic Amplitude: 1 mV
Lead Channel Sensing Intrinsic Amplitude: 6.25 mV
Lead Channel Sensing Intrinsic Amplitude: 6.25 mV
Lead Channel Setting Pacing Amplitude: 2.5 V
Lead Channel Setting Pacing Amplitude: 5 V
Lead Channel Setting Pacing Pulse Width: 1 ms
Lead Channel Setting Sensing Sensitivity: 0.3 mV

## 2021-10-29 DIAGNOSIS — H903 Sensorineural hearing loss, bilateral: Secondary | ICD-10-CM | POA: Diagnosis not present

## 2021-11-11 DIAGNOSIS — H25813 Combined forms of age-related cataract, bilateral: Secondary | ICD-10-CM | POA: Diagnosis not present

## 2021-11-11 DIAGNOSIS — E119 Type 2 diabetes mellitus without complications: Secondary | ICD-10-CM | POA: Diagnosis not present

## 2021-11-12 NOTE — Progress Notes (Signed)
Remote ICD transmission.   

## 2021-11-20 DIAGNOSIS — H6121 Impacted cerumen, right ear: Secondary | ICD-10-CM | POA: Diagnosis not present

## 2022-01-20 ENCOUNTER — Ambulatory Visit (INDEPENDENT_AMBULATORY_CARE_PROVIDER_SITE_OTHER): Payer: HMO

## 2022-01-20 DIAGNOSIS — I255 Ischemic cardiomyopathy: Secondary | ICD-10-CM

## 2022-01-20 LAB — CUP PACEART REMOTE DEVICE CHECK
Battery Remaining Longevity: 29 mo
Battery Voltage: 2.95 V
Brady Statistic AP VP Percent: 0 %
Brady Statistic AP VS Percent: 45.59 %
Brady Statistic AS VP Percent: 0 %
Brady Statistic AS VS Percent: 54.41 %
Brady Statistic RA Percent Paced: 45.35 %
Brady Statistic RV Percent Paced: 0 %
Date Time Interrogation Session: 20231205044222
HighPow Impedance: 47 Ohm
HighPow Impedance: 55 Ohm
Implantable Lead Connection Status: 753985
Implantable Lead Connection Status: 753985
Implantable Lead Implant Date: 20111212
Implantable Lead Implant Date: 20111212
Implantable Lead Location: 753859
Implantable Lead Location: 753860
Implantable Lead Model: 5076
Implantable Lead Model: 6947
Implantable Pulse Generator Implant Date: 20190826
Lead Channel Impedance Value: 304 Ohm
Lead Channel Impedance Value: 361 Ohm
Lead Channel Impedance Value: 399 Ohm
Lead Channel Pacing Threshold Amplitude: 1.5 V
Lead Channel Pacing Threshold Pulse Width: 0.4 ms
Lead Channel Sensing Intrinsic Amplitude: 0.875 mV
Lead Channel Sensing Intrinsic Amplitude: 0.875 mV
Lead Channel Sensing Intrinsic Amplitude: 6.5 mV
Lead Channel Sensing Intrinsic Amplitude: 6.5 mV
Lead Channel Setting Pacing Amplitude: 2.5 V
Lead Channel Setting Pacing Amplitude: 5 V
Lead Channel Setting Pacing Pulse Width: 1 ms
Lead Channel Setting Sensing Sensitivity: 0.3 mV
Zone Setting Status: 755011
Zone Setting Status: 755011

## 2022-02-18 NOTE — Progress Notes (Signed)
Remote ICD transmission.   

## 2022-04-08 DIAGNOSIS — E039 Hypothyroidism, unspecified: Secondary | ICD-10-CM | POA: Diagnosis not present

## 2022-04-19 DIAGNOSIS — I2510 Atherosclerotic heart disease of native coronary artery without angina pectoris: Secondary | ICD-10-CM | POA: Diagnosis not present

## 2022-04-19 DIAGNOSIS — J9811 Atelectasis: Secondary | ICD-10-CM | POA: Diagnosis not present

## 2022-04-19 DIAGNOSIS — D62 Acute posthemorrhagic anemia: Secondary | ICD-10-CM | POA: Diagnosis not present

## 2022-04-19 DIAGNOSIS — I444 Left anterior fascicular block: Secondary | ICD-10-CM | POA: Diagnosis not present

## 2022-04-19 DIAGNOSIS — Z888 Allergy status to other drugs, medicaments and biological substances status: Secondary | ICD-10-CM | POA: Diagnosis not present

## 2022-04-19 DIAGNOSIS — I498 Other specified cardiac arrhythmias: Secondary | ICD-10-CM | POA: Diagnosis not present

## 2022-04-19 DIAGNOSIS — I509 Heart failure, unspecified: Secondary | ICD-10-CM | POA: Diagnosis not present

## 2022-04-19 DIAGNOSIS — R1011 Right upper quadrant pain: Secondary | ICD-10-CM | POA: Diagnosis not present

## 2022-04-19 DIAGNOSIS — I10 Essential (primary) hypertension: Secondary | ICD-10-CM | POA: Diagnosis not present

## 2022-04-19 DIAGNOSIS — I5022 Chronic systolic (congestive) heart failure: Secondary | ICD-10-CM | POA: Diagnosis not present

## 2022-04-19 DIAGNOSIS — D72829 Elevated white blood cell count, unspecified: Secondary | ICD-10-CM | POA: Diagnosis not present

## 2022-04-19 DIAGNOSIS — K760 Fatty (change of) liver, not elsewhere classified: Secondary | ICD-10-CM | POA: Diagnosis not present

## 2022-04-19 DIAGNOSIS — Z7901 Long term (current) use of anticoagulants: Secondary | ICD-10-CM | POA: Diagnosis not present

## 2022-04-19 DIAGNOSIS — Z792 Long term (current) use of antibiotics: Secondary | ICD-10-CM | POA: Diagnosis not present

## 2022-04-19 DIAGNOSIS — K8020 Calculus of gallbladder without cholecystitis without obstruction: Secondary | ICD-10-CM | POA: Diagnosis not present

## 2022-04-19 DIAGNOSIS — R9431 Abnormal electrocardiogram [ECG] [EKG]: Secondary | ICD-10-CM | POA: Diagnosis not present

## 2022-04-19 DIAGNOSIS — R059 Cough, unspecified: Secondary | ICD-10-CM | POA: Diagnosis not present

## 2022-04-19 DIAGNOSIS — K8012 Calculus of gallbladder with acute and chronic cholecystitis without obstruction: Secondary | ICD-10-CM | POA: Diagnosis not present

## 2022-04-19 DIAGNOSIS — I252 Old myocardial infarction: Secondary | ICD-10-CM | POA: Diagnosis not present

## 2022-04-19 DIAGNOSIS — K810 Acute cholecystitis: Secondary | ICD-10-CM | POA: Diagnosis not present

## 2022-04-19 DIAGNOSIS — Z950 Presence of cardiac pacemaker: Secondary | ICD-10-CM | POA: Diagnosis not present

## 2022-04-19 DIAGNOSIS — K824 Cholesterolosis of gallbladder: Secondary | ICD-10-CM | POA: Diagnosis not present

## 2022-04-19 DIAGNOSIS — R079 Chest pain, unspecified: Secondary | ICD-10-CM | POA: Diagnosis not present

## 2022-04-19 DIAGNOSIS — K5730 Diverticulosis of large intestine without perforation or abscess without bleeding: Secondary | ICD-10-CM | POA: Diagnosis not present

## 2022-04-19 DIAGNOSIS — K828 Other specified diseases of gallbladder: Secondary | ICD-10-CM | POA: Diagnosis not present

## 2022-04-19 DIAGNOSIS — Z955 Presence of coronary angioplasty implant and graft: Secondary | ICD-10-CM | POA: Diagnosis not present

## 2022-04-19 DIAGNOSIS — Z7984 Long term (current) use of oral hypoglycemic drugs: Secondary | ICD-10-CM | POA: Diagnosis not present

## 2022-04-19 DIAGNOSIS — Z79899 Other long term (current) drug therapy: Secondary | ICD-10-CM | POA: Diagnosis not present

## 2022-04-19 DIAGNOSIS — E119 Type 2 diabetes mellitus without complications: Secondary | ICD-10-CM | POA: Diagnosis not present

## 2022-04-20 ENCOUNTER — Ambulatory Visit: Payer: HMO | Admitting: Cardiology

## 2022-04-29 DIAGNOSIS — Z09 Encounter for follow-up examination after completed treatment for conditions other than malignant neoplasm: Secondary | ICD-10-CM | POA: Diagnosis not present

## 2022-04-29 DIAGNOSIS — K8000 Calculus of gallbladder with acute cholecystitis without obstruction: Secondary | ICD-10-CM | POA: Diagnosis not present

## 2022-04-30 DIAGNOSIS — K8000 Calculus of gallbladder with acute cholecystitis without obstruction: Secondary | ICD-10-CM | POA: Diagnosis not present

## 2022-04-30 DIAGNOSIS — Z09 Encounter for follow-up examination after completed treatment for conditions other than malignant neoplasm: Secondary | ICD-10-CM | POA: Diagnosis not present

## 2022-04-30 DIAGNOSIS — Z0389 Encounter for observation for other suspected diseases and conditions ruled out: Secondary | ICD-10-CM | POA: Diagnosis not present

## 2022-05-01 DIAGNOSIS — Z09 Encounter for follow-up examination after completed treatment for conditions other than malignant neoplasm: Secondary | ICD-10-CM | POA: Diagnosis not present

## 2022-05-01 DIAGNOSIS — K8000 Calculus of gallbladder with acute cholecystitis without obstruction: Secondary | ICD-10-CM | POA: Diagnosis not present

## 2022-05-05 DIAGNOSIS — Z6834 Body mass index (BMI) 34.0-34.9, adult: Secondary | ICD-10-CM | POA: Diagnosis not present

## 2022-05-05 DIAGNOSIS — Z9049 Acquired absence of other specified parts of digestive tract: Secondary | ICD-10-CM | POA: Diagnosis not present

## 2022-05-22 DIAGNOSIS — R059 Cough, unspecified: Secondary | ICD-10-CM | POA: Diagnosis not present

## 2022-06-15 DIAGNOSIS — E039 Hypothyroidism, unspecified: Secondary | ICD-10-CM | POA: Diagnosis not present

## 2022-06-15 DIAGNOSIS — Z79899 Other long term (current) drug therapy: Secondary | ICD-10-CM | POA: Diagnosis not present

## 2022-07-08 ENCOUNTER — Encounter: Payer: Self-pay | Admitting: Cardiology

## 2022-07-08 ENCOUNTER — Ambulatory Visit: Payer: PPO | Attending: Cardiology | Admitting: Cardiology

## 2022-07-08 VITALS — BP 120/56 | HR 66 | Ht 62.0 in | Wt 179.0 lb

## 2022-07-08 DIAGNOSIS — E785 Hyperlipidemia, unspecified: Secondary | ICD-10-CM

## 2022-07-08 DIAGNOSIS — Z95810 Presence of automatic (implantable) cardiac defibrillator: Secondary | ICD-10-CM | POA: Diagnosis not present

## 2022-07-08 DIAGNOSIS — I10 Essential (primary) hypertension: Secondary | ICD-10-CM | POA: Diagnosis not present

## 2022-07-08 DIAGNOSIS — I2510 Atherosclerotic heart disease of native coronary artery without angina pectoris: Secondary | ICD-10-CM | POA: Diagnosis not present

## 2022-07-08 NOTE — Patient Instructions (Signed)

## 2022-07-08 NOTE — Progress Notes (Signed)
Cardiology Office Note:    Date:  07/08/2022   ID:  Christina Perry, DOB [birth year], MRN [id number]  PCP:  Philemon Kingdom, MD  Cardiologist:  Gypsy Balsam, MD    Referring MD: Philemon Kingdom, MD   Chief Complaint  Patient presents with   Follow-up  , I had my gallbladder surgery  History of Present Illness:    Christina Perry is a 72 y.o. female past medical history significant for coronary artery disease, status post anterior wall myocardial infarction 1996 with akinesis of the apex, ejection fraction 40%, essential hypertension, dyslipidemia, Medtronic ICD, LV thrombus. Comes today to months for follow-up overall doing very well.  Recently she ended up having gallbladder surgery did quite well for days in the hospital.  But recovering and doing nicely still works as a Engineer, civil (consulting)  Past Medical History:  Diagnosis Date   Anxiety    Cardiomyopathy ([facility])    Coronary artery disease involving native coronary artery of native heart without angina pectoris 11/02/2014   Overview:  Large anterior myocardial infarction and age of 67   Coronary artery stenosis    Defibrillator discharge 11/02/2014   Formatting of this note might be different from the original. 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/27/2017   MI (myocardial infarction) [facility])    Thyroid disease    Type 2 diabetes mellitus without complication ([facility]) 11/02/2014    Past Surgical History:  Procedure Laterality Date   BARIATRIC SURGERY  2005   CAROTID STENT     CESAREAN SECTION  1979   CORONARY ANGIOPLASTY     ICD GENERATOR CHANGEOUT N/A 10/11/2017   Procedure: ICD GENERATOR CHANGEOUT;  Surgeon: Regan Lemming, MD;  Location: [facility];  Service: Cardiovascular;  Laterality: N/A;   ICD  Placement     TONSILLECTOMY      Current Medications: Current Meds  Medication Sig   atorvastatin (LIPITOR) 20 MG tablet Take 20 mg by mouth daily.   carvedilol (COREG) 12.5 MG tablet Take 1 tablet (12.5 mg total) by mouth 2 (two) times daily.   ELIQUIS 5 MG TABS tablet Take 1 tablet by mouth twice daily (Patient taking differently: Take 5 mg by mouth 2 (two) times daily.)   EUTHYROX 200 MCG tablet Take 200 mcg by mouth every morning. Monday throught  Friday's   famotidine (PEPCID) 10 MG tablet Take 10 mg by mouth daily.   Fluticasone Furoate-Vilanterol (BREO ELLIPTA IN) Inhale 1 puff into the lungs in the morning.   furosemide (LASIX) 20 MG tablet Take 20 mg by mouth daily.   glimepiride (AMARYL) 4 MG tablet Take 4 mg by mouth daily.   Magnesium 500 MG TABS Take 500 mg by mouth 2 (two) times daily. 400-500 mg bid   metFORMIN (GLUCOPHAGE) 1000 MG tablet Take 1,000 mg by mouth 2 (two) times daily.   PARoxetine (PAXIL) 10 MG tablet Take 10 mg by mouth daily.     Allergies:   Bactrim [sulfamethoxazole-trimethoprim], Sulfa antibiotics, Cozaar [losartan], and Lisinopril   Social History   Socioeconomic History   Marital status: Married    Spouse name: Not on file   Number of children: Not on file   Years of education: Not on file   Highest education level: Not on file  Occupational  History   Not on file  Tobacco Use   Smoking status: Never   Smokeless tobacco: Never  Vaping Use   Vaping Use: Never used  Substance and Sexual Activity   Alcohol use: Yes    Comment: occ   Drug use: No   Sexual activity: Not on file  Other Topics Concern   Not on file  Social History Narrative   Not on file   Social Determinants of Health   Financial Resource Strain: Not on file  Food Insecurity: Not on file  Transportation Needs: Not on file  Physical Activity: Not on file  Stress: Not on file  Social Connections: Not on file     Family History: The patient's family history includes  CAD in her mother; COPD in her mother; Diabetes in her maternal grandmother and mother; Stroke in her maternal grandmother and mother. ROS:   Please see the history of present illness.    All 14 point review of systems negative except as described per history of present illness  EKGs/Labs/Other Studies Reviewed:      Recent Labs: No results found for requested labs within last 365 days.  Recent Lipid Panel No results found for: "CHOL", "TRIG", "HDL", "CHOLHDL", "VLDL", "LDLCALC", "LDLDIRECT"  Physical Exam:    VS:  BP (!) 120/56 (BP Location: Left Arm, Patient Position: Sitting)   Pulse 66   Ht 5\' 2"  (1.575 m)   Wt 179 lb (81.2 kg)   SpO2 98%   BMI 32.74 kg/m     Wt Readings from Last 3 Encounters:  07/08/22 179 lb (81.2 kg)  10/07/21 188 lb 3.2 oz (85.4 kg)  04/15/21 183 lb 3.2 oz (83.1 kg)     GEN:  Well nourished, well developed in no acute distress HEENT: Normal NECK: No JVD; No carotid bruits LYMPHATICS: No lymphadenopathy CARDIAC: RRR, no murmurs, no rubs, no gallops RESPIRATORY:  Clear to auscultation without rales, wheezing or rhonchi  ABDOMEN: Soft, non-tender, non-distended MUSCULOSKELETAL:  No edema; No deformity  SKIN: Warm and dry LOWER EXTREMITIES: no swelling NEUROLOGIC:  Alert and oriented x 3 PSYCHIATRIC:  Normal affect   ASSESSMENT:    1. Coronary artery disease involving native coronary artery of native heart without angina pectoris   2. Primary hypertension   3. ICD (implantable cardioverter-defibrillator) in place   4. Dyslipidemia    PLAN:    In order of problems listed above:  Coronary disease stable from that point review denies have any issues she denies have any symptoms that would indicate reactivation of the problem. Essential hypertension blood pressure well-controlled. Dyslipidemia I did review K PN, LDl 66, HDL 62.  Will continue present management. ICD present I did review interrogation still few years left in the device no  recent discharges. Cough which is chronic.  I recommend to try Nasonex or proton pump inhibitor if that does not help she talk to primary care physician and her pulmonologist   Medication Adjustments/Labs and Tests Ordered: Current medicines are reviewed at length with the patient today.  Concerns regarding medicines are outlined above.  No orders of the defined types were placed in this encounter.  Medication changes: No orders of the defined types were placed in this encounter.   Signed, Georgeanna Lea, MD, [facility] 07/08/2022 1:56 PM    [facility]

## 2022-07-09 DIAGNOSIS — L989 Disorder of the skin and subcutaneous tissue, unspecified: Secondary | ICD-10-CM | POA: Diagnosis not present

## 2022-07-09 DIAGNOSIS — N9089 Other specified noninflammatory disorders of vulva and perineum: Secondary | ICD-10-CM | POA: Diagnosis not present

## 2022-07-09 DIAGNOSIS — R059 Cough, unspecified: Secondary | ICD-10-CM | POA: Diagnosis not present

## 2022-07-09 DIAGNOSIS — Z6833 Body mass index (BMI) 33.0-33.9, adult: Secondary | ICD-10-CM | POA: Diagnosis not present

## 2022-07-09 DIAGNOSIS — R195 Other fecal abnormalities: Secondary | ICD-10-CM | POA: Diagnosis not present

## 2022-07-09 DIAGNOSIS — R350 Frequency of micturition: Secondary | ICD-10-CM | POA: Diagnosis not present

## 2022-07-21 ENCOUNTER — Ambulatory Visit (INDEPENDENT_AMBULATORY_CARE_PROVIDER_SITE_OTHER): Payer: PPO

## 2022-07-21 DIAGNOSIS — I255 Ischemic cardiomyopathy: Secondary | ICD-10-CM

## 2022-07-21 LAB — CUP PACEART REMOTE DEVICE CHECK
Battery Remaining Longevity: 28 mo
Battery Voltage: 2.94 V
Brady Statistic AP VP Percent: 0 %
Brady Statistic AP VS Percent: 38.99 %
Brady Statistic AS VP Percent: 0 %
Brady Statistic AS VS Percent: 61.01 %
Brady Statistic RA Percent Paced: 38.76 %
Brady Statistic RV Percent Paced: 0 %
Date Time Interrogation Session: 20240604044222
HighPow Impedance: 45 Ohm
HighPow Impedance: 55 Ohm
Implantable Lead Connection Status: 753985
Implantable Lead Connection Status: 753985
Implantable Lead Implant Date: 20111212
Implantable Lead Implant Date: 20111212
Implantable Lead Location: 753859
Implantable Lead Location: 753860
Implantable Lead Model: 5076
Implantable Lead Model: 6947
Implantable Pulse Generator Implant Date: 20190826
Lead Channel Impedance Value: 304 Ohm
Lead Channel Impedance Value: 399 Ohm
Lead Channel Impedance Value: 418 Ohm
Lead Channel Pacing Threshold Amplitude: 1.5 V
Lead Channel Pacing Threshold Pulse Width: 0.4 ms
Lead Channel Sensing Intrinsic Amplitude: 1 mV
Lead Channel Sensing Intrinsic Amplitude: 1 mV
Lead Channel Sensing Intrinsic Amplitude: 5.5 mV
Lead Channel Sensing Intrinsic Amplitude: 5.5 mV
Lead Channel Setting Pacing Amplitude: 2.5 V
Lead Channel Setting Pacing Amplitude: 5 V
Lead Channel Setting Pacing Pulse Width: 1 ms
Lead Channel Setting Sensing Sensitivity: 0.3 mV
Zone Setting Status: 755011
Zone Setting Status: 755011

## 2022-08-07 DIAGNOSIS — L821 Other seborrheic keratosis: Secondary | ICD-10-CM | POA: Diagnosis not present

## 2022-08-13 NOTE — Progress Notes (Signed)
Remote ICD transmission.   

## 2022-08-19 DIAGNOSIS — N390 Urinary tract infection, site not specified: Secondary | ICD-10-CM | POA: Diagnosis not present

## 2022-08-19 DIAGNOSIS — R300 Dysuria: Secondary | ICD-10-CM | POA: Diagnosis not present

## 2022-08-19 DIAGNOSIS — Z6833 Body mass index (BMI) 33.0-33.9, adult: Secondary | ICD-10-CM | POA: Diagnosis not present

## 2022-08-19 DIAGNOSIS — R059 Cough, unspecified: Secondary | ICD-10-CM | POA: Diagnosis not present

## 2022-08-19 DIAGNOSIS — R350 Frequency of micturition: Secondary | ICD-10-CM | POA: Diagnosis not present

## 2022-08-19 DIAGNOSIS — N898 Other specified noninflammatory disorders of vagina: Secondary | ICD-10-CM | POA: Diagnosis not present

## 2022-08-27 DIAGNOSIS — R051 Acute cough: Secondary | ICD-10-CM | POA: Diagnosis not present

## 2022-08-27 DIAGNOSIS — I700 Atherosclerosis of aorta: Secondary | ICD-10-CM | POA: Diagnosis not present

## 2022-08-27 DIAGNOSIS — I2510 Atherosclerotic heart disease of native coronary artery without angina pectoris: Secondary | ICD-10-CM | POA: Diagnosis not present

## 2022-09-03 ENCOUNTER — Telehealth: Payer: Self-pay | Admitting: Cardiology

## 2022-09-03 NOTE — Telephone Encounter (Signed)
Patient calling the office for samples of medication:   1.  What medication and dosage are you requesting samples for?   ELIQUIS 5 MG TABS tablet    2.  Are you currently out of this medication? Yes  Pt states that she is in the "donut hole". She is completely out of medication

## 2022-09-03 NOTE — Telephone Encounter (Signed)
Left vm for pt to callback 

## 2022-09-03 NOTE — Telephone Encounter (Signed)
Spoke with pt advised that we have no samples at this time. Encouraged to call next week. Pt verbalized understanding and had no further questions.

## 2022-09-08 NOTE — Telephone Encounter (Signed)
Patient is returning call and is requesting return call.

## 2022-09-08 NOTE — Telephone Encounter (Signed)
Advised we have no samples and to call [phone] for assistance with Eliquis. Pt verbalized understanding and had no additional questions.

## 2022-09-14 DIAGNOSIS — E039 Hypothyroidism, unspecified: Secondary | ICD-10-CM | POA: Diagnosis not present

## 2022-09-14 DIAGNOSIS — G250 Essential tremor: Secondary | ICD-10-CM | POA: Diagnosis not present

## 2022-09-14 DIAGNOSIS — Z0000 Encounter for general adult medical examination without abnormal findings: Secondary | ICD-10-CM | POA: Diagnosis not present

## 2022-09-14 DIAGNOSIS — I10 Essential (primary) hypertension: Secondary | ICD-10-CM | POA: Diagnosis not present

## 2022-09-14 DIAGNOSIS — E785 Hyperlipidemia, unspecified: Secondary | ICD-10-CM | POA: Diagnosis not present

## 2022-09-14 DIAGNOSIS — I480 Paroxysmal atrial fibrillation: Secondary | ICD-10-CM | POA: Diagnosis not present

## 2022-09-14 DIAGNOSIS — E1169 Type 2 diabetes mellitus with other specified complication: Secondary | ICD-10-CM | POA: Diagnosis not present

## 2022-09-14 DIAGNOSIS — Z79899 Other long term (current) drug therapy: Secondary | ICD-10-CM | POA: Diagnosis not present

## 2022-09-14 DIAGNOSIS — I25119 Atherosclerotic heart disease of native coronary artery with unspecified angina pectoris: Secondary | ICD-10-CM | POA: Diagnosis not present

## 2022-09-14 DIAGNOSIS — D6869 Other thrombophilia: Secondary | ICD-10-CM | POA: Diagnosis not present

## 2022-09-14 DIAGNOSIS — Z7189 Other specified counseling: Secondary | ICD-10-CM | POA: Diagnosis not present

## 2022-09-14 DIAGNOSIS — I5022 Chronic systolic (congestive) heart failure: Secondary | ICD-10-CM | POA: Diagnosis not present

## 2022-09-14 DIAGNOSIS — Z1331 Encounter for screening for depression: Secondary | ICD-10-CM | POA: Diagnosis not present

## 2022-10-20 ENCOUNTER — Ambulatory Visit (INDEPENDENT_AMBULATORY_CARE_PROVIDER_SITE_OTHER): Payer: PPO

## 2022-10-20 DIAGNOSIS — I255 Ischemic cardiomyopathy: Secondary | ICD-10-CM | POA: Diagnosis not present

## 2022-10-20 DIAGNOSIS — E039 Hypothyroidism, unspecified: Secondary | ICD-10-CM | POA: Diagnosis not present

## 2022-10-20 DIAGNOSIS — Z79899 Other long term (current) drug therapy: Secondary | ICD-10-CM | POA: Diagnosis not present

## 2022-10-20 LAB — CUP PACEART REMOTE DEVICE CHECK
Battery Remaining Longevity: 27 mo
Battery Voltage: 2.94 V
Brady Statistic AP VP Percent: 0 %
Brady Statistic AP VS Percent: 26.98 %
Brady Statistic AS VP Percent: 0 %
Brady Statistic AS VS Percent: 73.02 %
Brady Statistic RA Percent Paced: 26.81 %
Brady Statistic RV Percent Paced: 0 %
Date Time Interrogation Session: 20240903001804
HighPow Impedance: 46 Ohm
HighPow Impedance: 55 Ohm
Implantable Lead Connection Status: 753985
Implantable Lead Connection Status: 753985
Implantable Lead Implant Date: 20111212
Implantable Lead Implant Date: 20111212
Implantable Lead Location: 753859
Implantable Lead Location: 753860
Implantable Lead Model: 5076
Implantable Lead Model: 6947
Implantable Pulse Generator Implant Date: 20190826
Lead Channel Impedance Value: 304 Ohm
Lead Channel Impedance Value: 418 Ohm
Lead Channel Impedance Value: 418 Ohm
Lead Channel Pacing Threshold Amplitude: 1.5 V
Lead Channel Pacing Threshold Pulse Width: 0.4 ms
Lead Channel Sensing Intrinsic Amplitude: 1 mV
Lead Channel Sensing Intrinsic Amplitude: 1 mV
Lead Channel Sensing Intrinsic Amplitude: 5.375 mV
Lead Channel Sensing Intrinsic Amplitude: 5.375 mV
Lead Channel Setting Pacing Amplitude: 2.5 V
Lead Channel Setting Pacing Amplitude: 5 V
Lead Channel Setting Pacing Pulse Width: 1 ms
Lead Channel Setting Sensing Sensitivity: 0.3 mV
Zone Setting Status: 755011
Zone Setting Status: 755011

## 2022-10-28 NOTE — Progress Notes (Signed)
Remote ICD transmission.   

## 2022-11-03 DIAGNOSIS — D649 Anemia, unspecified: Secondary | ICD-10-CM | POA: Insufficient documentation

## 2022-11-23 DIAGNOSIS — R053 Chronic cough: Secondary | ICD-10-CM | POA: Insufficient documentation

## 2022-11-24 DIAGNOSIS — H6121 Impacted cerumen, right ear: Secondary | ICD-10-CM | POA: Diagnosis not present

## 2022-11-25 DIAGNOSIS — R0989 Other specified symptoms and signs involving the circulatory and respiratory systems: Secondary | ICD-10-CM | POA: Diagnosis not present

## 2022-11-25 DIAGNOSIS — R490 Dysphonia: Secondary | ICD-10-CM | POA: Insufficient documentation

## 2022-11-25 DIAGNOSIS — J383 Other diseases of vocal cords: Secondary | ICD-10-CM | POA: Diagnosis not present

## 2022-11-25 DIAGNOSIS — R053 Chronic cough: Secondary | ICD-10-CM | POA: Diagnosis not present

## 2022-11-25 DIAGNOSIS — J387 Other diseases of larynx: Secondary | ICD-10-CM | POA: Diagnosis not present

## 2022-12-25 DIAGNOSIS — N390 Urinary tract infection, site not specified: Secondary | ICD-10-CM | POA: Diagnosis not present

## 2023-01-04 DIAGNOSIS — J45991 Cough variant asthma: Secondary | ICD-10-CM | POA: Diagnosis not present

## 2023-01-04 DIAGNOSIS — E1169 Type 2 diabetes mellitus with other specified complication: Secondary | ICD-10-CM | POA: Diagnosis not present

## 2023-01-04 DIAGNOSIS — E785 Hyperlipidemia, unspecified: Secondary | ICD-10-CM | POA: Diagnosis not present

## 2023-01-04 DIAGNOSIS — J019 Acute sinusitis, unspecified: Secondary | ICD-10-CM | POA: Diagnosis not present

## 2023-01-04 DIAGNOSIS — I10 Essential (primary) hypertension: Secondary | ICD-10-CM | POA: Diagnosis not present

## 2023-01-04 DIAGNOSIS — N360 Urethral fistula: Secondary | ICD-10-CM | POA: Diagnosis not present

## 2023-01-04 DIAGNOSIS — E039 Hypothyroidism, unspecified: Secondary | ICD-10-CM | POA: Diagnosis not present

## 2023-01-04 DIAGNOSIS — Z6832 Body mass index (BMI) 32.0-32.9, adult: Secondary | ICD-10-CM | POA: Diagnosis not present

## 2023-01-04 DIAGNOSIS — N390 Urinary tract infection, site not specified: Secondary | ICD-10-CM | POA: Diagnosis not present

## 2023-01-19 ENCOUNTER — Ambulatory Visit (INDEPENDENT_AMBULATORY_CARE_PROVIDER_SITE_OTHER): Payer: PPO

## 2023-01-19 DIAGNOSIS — I255 Ischemic cardiomyopathy: Secondary | ICD-10-CM | POA: Diagnosis not present

## 2023-01-20 LAB — CUP PACEART REMOTE DEVICE CHECK
Battery Remaining Longevity: 27 mo
Battery Voltage: 2.94 V
Brady Statistic AP VP Percent: 0 %
Brady Statistic AP VS Percent: 26.27 %
Brady Statistic AS VP Percent: 0 %
Brady Statistic AS VS Percent: 73.73 %
Brady Statistic RA Percent Paced: 25.95 %
Brady Statistic RV Percent Paced: 0 %
Date Time Interrogation Session: 20241203001803
HighPow Impedance: 39 Ohm
HighPow Impedance: 49 Ohm
Implantable Lead Connection Status: 753985
Implantable Lead Connection Status: 753985
Implantable Lead Implant Date: 20111212
Implantable Lead Implant Date: 20111212
Implantable Lead Location: 753859
Implantable Lead Location: 753860
Implantable Lead Model: 5076
Implantable Lead Model: 6947
Implantable Pulse Generator Implant Date: 20190826
Lead Channel Impedance Value: 342 Ohm
Lead Channel Impedance Value: 361 Ohm
Lead Channel Impedance Value: 418 Ohm
Lead Channel Pacing Threshold Amplitude: 1.5 V
Lead Channel Pacing Threshold Pulse Width: 0.4 ms
Lead Channel Sensing Intrinsic Amplitude: 0.875 mV
Lead Channel Sensing Intrinsic Amplitude: 0.875 mV
Lead Channel Sensing Intrinsic Amplitude: 4.75 mV
Lead Channel Sensing Intrinsic Amplitude: 4.75 mV
Lead Channel Setting Pacing Amplitude: 2.5 V
Lead Channel Setting Pacing Amplitude: 5 V
Lead Channel Setting Pacing Pulse Width: 1 ms
Lead Channel Setting Sensing Sensitivity: 0.3 mV
Zone Setting Status: 755011
Zone Setting Status: 755011

## 2023-01-24 DIAGNOSIS — R0602 Shortness of breath: Secondary | ICD-10-CM | POA: Insufficient documentation

## 2023-02-11 DIAGNOSIS — L0291 Cutaneous abscess, unspecified: Secondary | ICD-10-CM | POA: Insufficient documentation

## 2023-02-15 ENCOUNTER — Encounter: Payer: Self-pay | Admitting: Cardiology

## 2023-02-15 ENCOUNTER — Telehealth: Payer: Self-pay

## 2023-02-15 ENCOUNTER — Ambulatory Visit: Payer: PPO | Attending: Cardiology | Admitting: Cardiology

## 2023-02-15 VITALS — BP 116/52 | HR 71 | Ht 62.0 in | Wt 171.4 lb

## 2023-02-15 DIAGNOSIS — Z794 Long term (current) use of insulin: Secondary | ICD-10-CM

## 2023-02-15 DIAGNOSIS — I255 Ischemic cardiomyopathy: Secondary | ICD-10-CM | POA: Diagnosis not present

## 2023-02-15 DIAGNOSIS — E119 Type 2 diabetes mellitus without complications: Secondary | ICD-10-CM

## 2023-02-15 DIAGNOSIS — E785 Hyperlipidemia, unspecified: Secondary | ICD-10-CM

## 2023-02-15 DIAGNOSIS — I10 Essential (primary) hypertension: Secondary | ICD-10-CM | POA: Diagnosis not present

## 2023-02-15 DIAGNOSIS — R0609 Other forms of dyspnea: Secondary | ICD-10-CM | POA: Diagnosis not present

## 2023-02-15 DIAGNOSIS — Z95810 Presence of automatic (implantable) cardiac defibrillator: Secondary | ICD-10-CM

## 2023-02-15 DIAGNOSIS — I2510 Atherosclerotic heart disease of native coronary artery without angina pectoris: Secondary | ICD-10-CM

## 2023-02-15 NOTE — Addendum Note (Signed)
Addended by: Baldo Ash D on: 02/15/2023 12:45 PM   Modules accepted: Orders

## 2023-02-15 NOTE — Addendum Note (Signed)
Addended by: Baldo Ash D on: 02/15/2023 10:59 AM   Modules accepted: Orders

## 2023-02-15 NOTE — Telephone Encounter (Signed)
   No answer, left detailed message for pt to send manual transmission per DPR.

## 2023-02-15 NOTE — Telephone Encounter (Signed)
-----   Message from Nurse Natalia Leatherwood F sent at 02/15/2023 11:11 AM EST ----- Can you help with this? ----- Message ----- From: Georgeanna Lea, MD Sent: 02/15/2023  10:49 AM EST To: Baird Lyons, RN  Sheri, could you please make arrangements for interrogation of his device remotely for this lady I am interested in her OptiVol

## 2023-02-15 NOTE — Patient Instructions (Addendum)
Medication Instructions:  Your physician recommends that you continue on your current medications as directed. Please refer to the Current Medication list given to you today.  *If you need a refill on your cardiac medications before your next appointment, please call your pharmacy*   Lab Work: None Ordered If you have labs (blood work) drawn today and your tests are completely normal, you will receive your results only by: MyChart Message (if you have MyChart) OR A paper copy in the mail If you have any lab test that is abnormal or we need to change your treatment, we will call you to review the results.   Testing/Procedures: Your physician has requested that you have an echocardiogram. Echocardiography is a painless test that uses sound waves to create images of your heart. It provides your doctor with information about the size and shape of your heart and how well your heart's chambers and valves are working. This procedure takes approximately one hour. There are no restrictions for this procedure. Please do NOT wear cologne, perfume, aftershave, or lotions (deodorant is allowed). Please arrive 15 minutes prior to your appointment time.  Your physician has requested that you have a lexiscan myoview. For further information please visit [email or web address] Please follow instruction sheet, as given.  The test will take approximately 3 to 4 hours to complete; you may bring reading material.  If someone comes with you to your appointment, they will need to remain in the main lobby due to limited space in the testing area.   How to prepare for your Myocardial Perfusion Test: Do not eat or drink 3 hours prior to your test, except you may have water. Do not consume products containing caffeine (regular or decaffeinated) 12 hours prior to your test. (ex: coffee, chocolate, sodas, tea). Do bring a list of your current medications with you.  If not listed below, you may take your medications as  normal. Do wear comfortable clothes (no dresses or overalls) and walking shoes, tennis shoes preferred (No heels or open toe shoes are allowed). Do NOT wear cologne, perfume, aftershave, or lotions (deodorant is allowed). If these instructions are not followed, your test will have to be rescheduled.    Please note: We ask at that you not bring children with you during ultrasound (echo/ vascular) testing. Due to room size and safety concerns, children are not allowed in the ultrasound rooms during exams. Our front office staff cannot provide observation of children in our lobby area while testing is being conducted. An adult accompanying a patient to their appointment will only be allowed in the ultrasound room at the discretion of the ultrasound technician under special circumstances. We apologize for any inconvenience.    Follow-Up: At [facility], you and your health needs are our priority.  As part of our continuing mission to provide you with exceptional heart care, we have created designated Provider Care Teams.  These Care Teams include your primary Cardiologist (physician) and Advanced Practice Providers (APPs -  Physician Assistants and Nurse Practitioners) who all work together to provide you with the care you need, when you need it.  We recommend signing up for the patient portal called "MyChart".  Sign up information is provided on this After Visit Summary.  MyChart is used to connect with patients for Virtual Visits (Telemedicine).  Patients are able to view lab/test results, encounter notes, upcoming appointments, etc.  Non-urgent messages can be sent to your provider as well.   To learn more about what you can  do with MyChart, go to [email or web address].    Your next appointment:   1 month(s)  The format for your next appointment:   In Person  Provider:   Gypsy Balsam, MD    Other Instructions NA

## 2023-02-15 NOTE — Progress Notes (Signed)
Cardiology Office Note:    Date:  02/15/2023   ID:  [name], DOB March 30, 1950, MRN [id number]  PCP:  Philemon Kingdom, MD  Cardiologist:  Gypsy Balsam, MD    Referring MD: Philemon Kingdom, MD   Chief Complaint  Patient presents with   Shortness of Breath        OTHER    "Heaviness" in chest and lack of energy     History of Present Illness:    Christina Perry is a 72 y.o. female past medical history significant for coronary artery disease status post anterior wall myocardial infarction 1996 with akinesis of the apex, ejection fraction 40%, essential hypertension, dyslipidemia, Medtronic ICD, LV thrombus. Comes today to my office for follow-up he should just come back from a trip to [place] that trip involve a lot of walking and she could not do it she said she have to stop many times going in different places she admitted that this is unusually high level of exercise for her however she was getting short of breath also uneasy sensation sometimes in the chest sometimes coughing.  She always downgrade her symptoms but.  The symptoms are looking quite worrisome.  Since the time of return to [place] she is doing fine and is asymptomatic  Past Medical History:  Diagnosis Date   Anxiety    Cardiomyopathy (HCC)    Coronary artery disease involving native coronary artery of native heart without angina pectoris 11/02/2014   Overview:  Large anterior myocardial infarction and age of 9   Coronary artery stenosis    Defibrillator discharge 11/02/2014   Formatting of this note might be different from the original. 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/27/2017   MI (myocardial infarction) [facility])    Thyroid disease    Type 2 diabetes mellitus without  complication (HCC) 11/02/2014    Past Surgical History:  Procedure Laterality Date   BARIATRIC SURGERY  2005   CAROTID STENT     CESAREAN SECTION  1979   CORONARY ANGIOPLASTY     ICD GENERATOR CHANGEOUT N/A 10/11/2017   Procedure: ICD GENERATOR CHANGEOUT;  Surgeon: Regan Lemming, MD;  Location[facility]AS[facility] CV LAB;  Service: Cardiovascular;  Laterality: N/A;   ICD Placement     TONSILLECTOMY      Current Medications: Current Meds  Medication Sig   atorvastatin (LIPITOR) 20 MG tablet Take 20 mg by mouth daily.   carvedilol (COREG) 12.5 MG tablet Take 1 tablet (12.5 mg total) by mouth 2 (two) times daily.   ELIQUIS 5 MG TABS tablet Take 1 tablet by mouth twice daily   EUTHYROX 200 MCG tablet Take 200 mcg by mouth every morning. Monday throught  Friday's   Fluticasone Furoate-Vilanterol (BREO ELLIPTA IN) Inhale 1 puff into the lungs in the morning.   furosemide (LASIX) 20 MG tablet Take 20 mg by mouth daily.   glimepiride (AMARYL) 4 MG tablet Take 4 mg by mouth daily.   Magnesium 500 MG TABS Take 500 mg by mouth 2 (two) times daily. 400-500 mg bid   metFORMIN (GLUCOPHAGE) 1000 MG tablet Take 1,000 mg by mouth 2 (two) times daily.   PARoxetine (PAXIL) 10 MG tablet Take 10 mg by mouth daily.     Allergies:   Bactrim [  sulfamethoxazole-trimethoprim], Sulfa antibiotics, Cozaar [losartan], and Lisinopril   Social History   Socioeconomic History   Marital status: Married    Spouse name: Not on file   Number of children: Not on file   Years of education: Not on file   Highest education level: Not on file  Occupational History   Not on file  Tobacco Use   Smoking status: Never   Smokeless tobacco: Never  Vaping Use   Vaping status: Never Used  Substance and Sexual Activity   Alcohol use: Yes    Comment: occ   Drug use: No   Sexual activity: Not on file  Other Topics Concern   Not on file  Social History Narrative   Not on file   Social Drivers of Health   Financial  Resource Strain: Not on file  Food Insecurity: Not on file  Transportation Needs: Not on file  Physical Activity: Not on file  Stress: Not on file  Social Connections: Not on file     Family History: The patient's family history includes CAD in her mother; COPD in her mother; Diabetes in her maternal grandmother and mother; Stroke in her maternal grandmother and mother. ROS:   Please see the history of present illness.    All 14 point review of systems negative except as described per history of present illness  EKGs/Labs/Other Studies Reviewed:         Recent Labs: No results found for requested labs within last 365 days.  Recent Lipid Panel No results found for: "CHOL", "TRIG", "HDL", "CHOLHDL", "VLDL", "LDLCALC", "LDLDIRECT"  Physical Exam:    VS:  BP (!) 116/52   Pulse 71   Ht 5\' 2"  (1.575 m)   Wt 171 lb 6.4 oz (77.7 kg)   SpO2 98%   BMI 31.35 kg/m     Wt Readings from Last 3 Encounters:  02/15/23 171 lb 6.4 oz (77.7 kg)  07/08/22 179 lb (81.2 kg)  10/07/21 188 lb 3.2 oz (85.4 kg)     GEN:  Well nourished, well developed in no acute distress HEENT: Normal NECK: No JVD; No carotid bruits LYMPHATICS: No lymphadenopathy CARDIAC: RRR, no murmurs, no rubs, no gallops RESPIRATORY:  Clear to auscultation without rales, wheezing or rhonchi  ABDOMEN: Soft, non-tender, non-distended MUSCULOSKELETAL:  No edema; No deformity  SKIN: Warm and dry LOWER EXTREMITIES: no swelling NEUROLOGIC:  Alert and oriented x 3 PSYCHIATRIC:  Normal affect   ASSESSMENT:    1. Ischemic cardiomyopathy   2. Coronary artery disease involving native coronary artery of native heart without angina pectoris   3. Primary hypertension   4. Type 2 diabetes mellitus without complication, with long-term current use of insulin (HCC)   5. Dyslipidemia   6. ICD (implantable cardioverter-defibrillator) in place    PLAN:    In order of problems listed above:  History of ischemic cardiomyopathy I  will ask her to have echocardiogram done to recheck left ventricle ejection fraction, she does have some difficulty tolerating medications but if her ejection fraction still diminished we need to push harder to get her on the right staff. Coronary artery disease will schedule her to have a stress test to see if there is any residual changes she may require different cardiac catheterization. Type 2 diabetes followed by antimedicine team. Dyslipidemia I did review K PN which show me LDL of 66 HDL 62 continue present management. ICD last interrogation at the beginning of December I will ask EP team for remote interrogation to  check the OptiVol during the time she was during the trip   Medication Adjustments/Labs and Tests Ordered: Current medicines are reviewed at length with the patient today.  Concerns regarding medicines are outlined above.  No orders of the defined types were placed in this encounter.  Medication changes: No orders of the defined types were placed in this encounter.   Signed, Georgeanna Lea, MD, [facility] 02/15/2023 10:47 AM    [facility]

## 2023-02-16 DIAGNOSIS — N390 Urinary tract infection, site not specified: Secondary | ICD-10-CM | POA: Diagnosis not present

## 2023-02-16 DIAGNOSIS — R300 Dysuria: Secondary | ICD-10-CM | POA: Diagnosis not present

## 2023-02-16 DIAGNOSIS — Z6832 Body mass index (BMI) 32.0-32.9, adult: Secondary | ICD-10-CM | POA: Diagnosis not present

## 2023-02-16 DIAGNOSIS — L03311 Cellulitis of abdominal wall: Secondary | ICD-10-CM | POA: Diagnosis not present

## 2023-02-16 NOTE — Telephone Encounter (Signed)
 Patient given detailed instructions per Myocardial Perfusion Study Information Sheet for the test on 02/18/23 Patient notified to arrive 15 minutes early and that it is imperative to arrive on time for appointment to keep from having the test rescheduled.  If you need to cancel or reschedule your appointment, please call the office within 24 hours of your appointment. . Patient verbalized understanding.Claudene Ronal Quale

## 2023-02-16 NOTE — Telephone Encounter (Signed)
 Left detailed message on VM to send manual transmission per DPR.

## 2023-02-18 ENCOUNTER — Ambulatory Visit: Payer: PPO | Attending: Cardiology

## 2023-02-18 DIAGNOSIS — R0609 Other forms of dyspnea: Secondary | ICD-10-CM

## 2023-02-18 LAB — MYOCARDIAL PERFUSION IMAGING
LV dias vol: 138 mL (ref 46–106)
LV sys vol: 81 mL
Nuc Stress EF: 41 %
Peak HR: 106 {beats}/min
Rest HR: 63 {beats}/min
Rest Nuclear Isotope Dose: 10.4 mCi
SDS: 1
SRS: 31
SSS: 32
Stress Nuclear Isotope Dose: 29.7 mCi
TID: 1.04

## 2023-02-18 MED ORDER — REGADENOSON 0.4 MG/5ML IV SOLN
0.4000 mg | Freq: Once | INTRAVENOUS | Status: AC
  Administered 2023-02-18: 0.4 mg via INTRAVENOUS

## 2023-02-18 MED ORDER — TECHNETIUM TC 99M TETROFOSMIN IV KIT
10.4000 | PACK | Freq: Once | INTRAVENOUS | Status: AC | PRN
  Administered 2023-02-18: 10.4 via INTRAVENOUS

## 2023-02-18 MED ORDER — TECHNETIUM TC 99M TETROFOSMIN IV KIT
29.7000 | PACK | Freq: Once | INTRAVENOUS | Status: AC | PRN
  Administered 2023-02-18: 29.7 via INTRAVENOUS

## 2023-02-19 ENCOUNTER — Telehealth: Payer: Self-pay

## 2023-02-19 DIAGNOSIS — L02211 Cutaneous abscess of abdominal wall: Secondary | ICD-10-CM | POA: Diagnosis not present

## 2023-02-19 DIAGNOSIS — L0291 Cutaneous abscess, unspecified: Secondary | ICD-10-CM | POA: Diagnosis not present

## 2023-02-19 DIAGNOSIS — S30851A Superficial foreign body of abdominal wall, initial encounter: Secondary | ICD-10-CM | POA: Diagnosis not present

## 2023-02-19 NOTE — Telephone Encounter (Signed)
 Pt monitor is not working and is getting a new monitor and will receive it 7-10 business days. Pt is unable to send a transmission at this time

## 2023-02-19 NOTE — Telephone Encounter (Signed)
 LMOVM for pt to send a manual transmission. If needed she can call device clinic for help.

## 2023-02-19 NOTE — Telephone Encounter (Signed)
 Outreach made to Pt.     Left detailed message.  Requested Pt stop by [place] office on Monday 02/21/2022 between 12:15-12:30 pm for a quick device interrogation to check Optivol.  Will attempt to contact Pt on Monday morning.

## 2023-02-19 NOTE — Telephone Encounter (Signed)
-----   Message from Nurse Natalia Leatherwood F sent at 02/15/2023 11:11 AM EST ----- Can you help with this? ----- Message ----- From: Georgeanna Lea, MD Sent: 02/15/2023  10:49 AM EST To: Baird Lyons, RN  Sheri, could you please make arrangements for interrogation of his device remotely for this lady I am interested in her OptiVol

## 2023-02-19 NOTE — Telephone Encounter (Signed)
 Spoke to [place], RN about adding patient onto [place] device clinic next Monday. She will find a time that is good to work in.

## 2023-02-21 DIAGNOSIS — Z5189 Encounter for other specified aftercare: Secondary | ICD-10-CM | POA: Diagnosis not present

## 2023-02-21 DIAGNOSIS — L0291 Cutaneous abscess, unspecified: Secondary | ICD-10-CM | POA: Diagnosis not present

## 2023-02-21 DIAGNOSIS — Z4800 Encounter for change or removal of nonsurgical wound dressing: Secondary | ICD-10-CM | POA: Diagnosis not present

## 2023-02-22 NOTE — Telephone Encounter (Signed)
 Spoke w/ pt over phone. She will not be coming into [place] office today between [phone] citing an abscess in her abdomen. Pt doesn't want to come to either office and said she will send a transmission w/ optivol info when she gets her new remote monitor.

## 2023-02-22 NOTE — Telephone Encounter (Signed)
 When pts monitor comes, can you have pt send transmission.

## 2023-02-23 NOTE — Telephone Encounter (Signed)
 Pt LMOVM that she received her new monitor. I will call 02/24/2023 for a manual transmission.

## 2023-02-24 DIAGNOSIS — Z6832 Body mass index (BMI) 32.0-32.9, adult: Secondary | ICD-10-CM | POA: Diagnosis not present

## 2023-02-24 DIAGNOSIS — L0291 Cutaneous abscess, unspecified: Secondary | ICD-10-CM | POA: Diagnosis not present

## 2023-02-25 DIAGNOSIS — L0291 Cutaneous abscess, unspecified: Secondary | ICD-10-CM | POA: Diagnosis not present

## 2023-02-25 NOTE — Telephone Encounter (Signed)
 Transmission received 02/25/2023. I let pt know the nurse will review it and give them a call back.

## 2023-02-25 NOTE — Telephone Encounter (Signed)
 Christina Perry

## 2023-03-01 DIAGNOSIS — L900 Lichen sclerosus et atrophicus: Secondary | ICD-10-CM | POA: Diagnosis not present

## 2023-03-01 DIAGNOSIS — N898 Other specified noninflammatory disorders of vagina: Secondary | ICD-10-CM | POA: Diagnosis not present

## 2023-03-02 ENCOUNTER — Telehealth: Payer: Self-pay

## 2023-03-02 DIAGNOSIS — I10 Essential (primary) hypertension: Secondary | ICD-10-CM

## 2023-03-02 DIAGNOSIS — R0609 Other forms of dyspnea: Secondary | ICD-10-CM

## 2023-03-02 MED ORDER — POTASSIUM CHLORIDE ER 10 MEQ PO TBCR: 10.0000 meq | EXTENDED_RELEASE_TABLET | Freq: Every day | ORAL | 0 refills | Status: DC

## 2023-03-02 NOTE — Telephone Encounter (Signed)
 After reviewing device transmission Dr. Bing Matter recommended that pt increase dose of her Lasix to 40 mg daily to 10 mg potassium, she is to have Chem-7 proBNP done within a week.

## 2023-03-02 NOTE — Telephone Encounter (Signed)
 Pt notified per Dr. Vanetta Shawl note. She verbalized understanding and had no further questions.

## 2023-03-03 ENCOUNTER — Telehealth: Payer: Self-pay

## 2023-03-03 DIAGNOSIS — L0291 Cutaneous abscess, unspecified: Secondary | ICD-10-CM | POA: Diagnosis not present

## 2023-03-03 NOTE — Telephone Encounter (Signed)
 Stress Test Results reviewed with pt as per Dr. Vanetta Shawl note.  Pt verbalized understanding and had no additional questions. Routed to PCP

## 2023-03-04 ENCOUNTER — Ambulatory Visit: Payer: PPO

## 2023-03-04 ENCOUNTER — Ambulatory Visit: Payer: PPO | Attending: Cardiology

## 2023-03-04 DIAGNOSIS — R0609 Other forms of dyspnea: Secondary | ICD-10-CM

## 2023-03-04 LAB — ECHOCARDIOGRAM COMPLETE
Area-P 1/2: 4.18 cm2
Est EF: 40
S' Lateral: 4 cm

## 2023-03-04 MED ORDER — PERFLUTREN LIPID MICROSPHERE
1.0000 mL | INTRAVENOUS | Status: AC | PRN
  Administered 2023-03-04: 10 mL via INTRAVENOUS

## 2023-03-15 ENCOUNTER — Ambulatory Visit: Payer: PPO | Attending: Cardiology | Admitting: Cardiology

## 2023-03-15 VITALS — BP 110/50 | HR 65 | Ht 62.0 in | Wt 165.4 lb

## 2023-03-15 DIAGNOSIS — I255 Ischemic cardiomyopathy: Secondary | ICD-10-CM | POA: Diagnosis not present

## 2023-03-15 DIAGNOSIS — I2510 Atherosclerotic heart disease of native coronary artery without angina pectoris: Secondary | ICD-10-CM | POA: Diagnosis not present

## 2023-03-15 DIAGNOSIS — Z794 Long term (current) use of insulin: Secondary | ICD-10-CM

## 2023-03-15 DIAGNOSIS — E119 Type 2 diabetes mellitus without complications: Secondary | ICD-10-CM | POA: Diagnosis not present

## 2023-03-15 DIAGNOSIS — E785 Hyperlipidemia, unspecified: Secondary | ICD-10-CM

## 2023-03-15 DIAGNOSIS — R0609 Other forms of dyspnea: Secondary | ICD-10-CM | POA: Diagnosis not present

## 2023-03-15 MED ORDER — RANOLAZINE ER 500 MG PO TB12: 500.0000 mg | ORAL_TABLET | Freq: Two times a day (BID) | ORAL | 1 refills | Status: DC

## 2023-03-15 NOTE — Addendum Note (Signed)
Addended by: Lonia Farber on: 03/15/2023 02:16 PM   Modules accepted: Orders

## 2023-03-15 NOTE — Addendum Note (Signed)
Addended by: Lonia Farber on: 03/15/2023 02:06 PM   Modules accepted: Orders

## 2023-03-15 NOTE — Patient Instructions (Addendum)
Medication Instructions:   Start taking Ranolazine 500 mg twice a day.    *If you need a refill on your cardiac medications before your next appointment, please call your pharmacy*   Lab Work: Your physician recommends that you have labs done in the office today. Your test included CMP, Pro BNP, TSH, B12, Vitamin D3.   If you have labs (blood work) drawn today and your tests are completely normal, you will receive your results only by: MyChart Message (if you have MyChart) OR A paper copy in the mail If you have any lab test that is abnormal or we need to change your treatment, we will call you to review the results.   Testing/Procedures: Non-Cardiac CT scanning, (CAT scanning), is a noninvasive, special x-ray that produces cross-sectional images of the body using x-rays and a computer. CT scans help physicians diagnose and treat medical conditions. For some CT exams, a contrast material is used to enhance visibility in the area of the body being studied. CT scans provide greater clarity and reveal more details than regular x-ray exams.    Follow-Up: At [facility], you and your health needs are our priority.  As part of our continuing mission to provide you with exceptional heart care, we have created designated Provider Care Teams.  These Care Teams include your primary Cardiologist (physician) and Advanced Practice Providers (APPs -  Physician Assistants and Nurse Practitioners) who all work together to provide you with the care you need, when you need it.  We recommend signing up for the patient portal called "MyChart".  Sign up information is provided on this After Visit Summary.  MyChart is used to connect with patients for Virtual Visits (Telemedicine).  Patients are able to view lab/test results, encounter notes, upcoming appointments, etc.  Non-urgent messages can be sent to your provider as well.   To learn more about what you can do with MyChart, go to  [email or web address].    Your next appointment:   1 month

## 2023-03-15 NOTE — Progress Notes (Signed)
Cardiology Office Note:    Date:  03/15/2023   ID:  [name], DOB 11-Apr-1950, MRN [id number]  PCP:  Philemon Kingdom, MD  Cardiologist:  Gypsy Balsam, MD    Referring MD: Philemon Kingdom, MD   Chief Complaint  Patient presents with   Results   Shortness of Breath    History of Present Illness:    Christina Perry is a 73 y.o. female past medical history significant for coronary disease, status post myocardial infarction 1996 with akinesis of apex usually ejection fraction 40%, essential hypertension, dyslipidemia, Medtronic ICD, LV thrombus history of.  She comes today to my office not doing well.  I did see her last time she just came back from [place] she was however complaining about the fact she could not keep up with anybody when she was there she was getting short of breath quite easily no chest pain tightness heaviness squeezing in the chest.  Evaluation included echocardiogram which confirmed presence of ejection fraction being 40% as before, we did also stress test which showed no evidence of ischemia just old MI still puzzling about her symptomatology.  I gave her a slightly higher dose of Lasix which seems to be helping somewhat but still not up to the part she is very disappointed in does not like the way she feels.  Chief complaint is shortness of breath as well as some cough.  Past Medical History:  Diagnosis Date   Anxiety    Cardiomyopathy (HCC)    Coronary artery disease involving native coronary artery of native heart without angina pectoris 11/02/2014   Overview:  Large anterior myocardial infarction and age of 81   Coronary artery stenosis    Defibrillator discharge 11/02/2014   Formatting of this note might be different from the original. 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   LV (left  ventricular) mural thrombus following MI (HCC) 09/27/2017   MI [facility]myocardial infarction) [facility])    Thyroid disease    Type 2 diabetes mellitus without complication (HCC) 11/02/2014    Past Surgical History:  Procedure Laterality Date   BARIATRIC SURGERY  2005   CAROTID STENT     CESAREAN SECTION  1979   CORONARY ANGIOPLASTY     ICD GENERATOR CHANGEOUT N/A 10/11/2017   Procedure: ICD GENERATOR CHANGEOUT;  Surgeon: Regan Lemming, MD;  Location: [facility] INVASIVE CV LAB;  Service: Cardiovascular;  Laterality: N/A;   ICD Placement     TONSILLECTOMY      Current Medications: Current Meds  Medication Sig   atorvastatin (LIPITOR) 20 MG tablet Take 20 mg by mouth daily.   carvedilol (COREG) 12.5 MG tablet Take 1 tablet (12.5 mg total) by mouth 2 (two) times daily.   ELIQUIS 5 MG TABS tablet Take 1 tablet by mouth twice daily (Patient taking differently: Take 5 mg by mouth 2 (two) times daily.)   Estradiol (ESTRACE PO) Take 1 tablet by mouth daily.   EUTHYROX 200 MCG tablet Take 200 mcg by mouth every morning. Monday throught  Friday's   Fluticasone Furoate-Vilanterol (BREO ELLIPTA IN) Inhale 1 puff into the lungs in the morning.   furosemide (LASIX) 20 MG tablet Take 40 mg by mouth daily.   glimepiride (AMARYL) 4 MG tablet Take 4 mg by mouth daily.   Magnesium 500 MG TABS Take 500 mg by mouth  2 (two) times daily. 400-500 mg bid   metFORMIN (GLUCOPHAGE) 1000 MG tablet Take 1,000 mg by mouth 2 (two) times daily.   PARoxetine (PAXIL) 10 MG tablet Take 10 mg by mouth daily.   potassium chloride (KLOR-CON) 10 MEQ tablet Take 1 tablet (10 mEq total) by mouth daily.     Allergies:   Bactrim [sulfamethoxazole-trimethoprim], Sulfa antibiotics, Cozaar [losartan], and Lisinopril   Social History   Socioeconomic History   Marital status: Married    Spouse name: Not on file   Number of children: Not on file   Years of education: Not on file   Highest education level: Not on file  Occupational  History   Not on file  Tobacco Use   Smoking status: Never   Smokeless tobacco: Never  Vaping Use   Vaping status: Never Used  Substance and Sexual Activity   Alcohol use: Yes    Comment: occ   Drug use: No   Sexual activity: Not on file  Other Topics Concern   Not on file  Social History Narrative   Not on file   Social Drivers of Health   Financial Resource Strain: Not on file  Food Insecurity: Not on file  Transportation Needs: Not on file  Physical Activity: Not on file  Stress: Not on file  Social Connections: Not on file     Family History: The patient's family history includes CAD in her mother; COPD in her mother; Diabetes in her maternal grandmother and mother; Stroke in her maternal grandmother and mother. ROS:   Please see the history of present illness.    All 14 point review of systems negative except as described per history of present illness  EKGs/Labs/Other Studies Reviewed:         Recent Labs: No results found for requested labs within last 365 days.  Recent Lipid Panel No results found for: "CHOL", "TRIG", "HDL", "CHOLHDL", "VLDL", "LDLCALC", "LDLDIRECT"  Physical Exam:    VS:  BP (!) 110/50 (BP Location: Right Arm, Patient Position: Sitting)   Pulse 65   Ht 5\' 2"  (1.575 m)   Wt 165 lb 6.4 oz (75 kg)   SpO2 97%   BMI 30.25 kg/m     Wt Readings from Last 3 Encounters:  03/15/23 165 lb 6.4 oz (75 kg)  02/15/23 171 lb 6.4 oz (77.7 kg)  07/08/22 179 lb (81.2 kg)     GEN:  Well nourished, well developed in no acute distress HEENT: Normal NECK: No JVD; No carotid bruits LYMPHATICS: No lymphadenopathy CARDIAC: RRR, no murmurs, no rubs, no gallops RESPIRATORY:  Clear to auscultation without rales, wheezing or rhonchi  ABDOMEN: Soft, non-tender, non-distended MUSCULOSKELETAL:  No edema; No deformity  SKIN: Warm and dry LOWER EXTREMITIES: no swelling NEUROLOGIC:  Alert and oriented x 3 PSYCHIATRIC:  Normal affect   ASSESSMENT:    1.  Coronary artery disease involving native coronary artery of native heart without angina pectoris   2. Ischemic cardiomyopathy   3. Type 2 diabetes mellitus without complication, with long-term current use of insulin (HCC)   4. Dyslipidemia    PLAN:    In order of problems listed above:  Coronary disease stress test being negative.  We talked about potentially doing left and right cardiac catheterization 20 find out if there is any cardiac explanation for his symptomatology.  She does not want to do that she preferred to do investigation for her lungs, I will schedule her to have CT of her chest  to make sure were not missing any significant pathology over there.  She is anticoagulated chronically therefore I do not think thromboembolic event play some role here but will look at her lungs. History of ischemic cardiomyopathy still ejection fraction 40% which is unchanged she does not explain the way she feels I will give her elevated Lasix seems to be helping somewhat but not much.  Will check proBNP Chem-7 today. Type 2 diabetes being followed by antimedicine team. Dyslipidemia on Lipitor 20 which I will continue. Puzzling scenario she clearly deteriorated overall from physical point of view so far cardiac workup negative will do CT of the chest if that is negative then will come back to heart and probably required left and right cardiac catheterization.  In the meantime I will initiate ranolazine 500 twice daily   Medication Adjustments/Labs and Tests Ordered: Current medicines are reviewed at length with the patient today.  Concerns regarding medicines are outlined above.  No orders of the defined types were placed in this encounter.  Medication changes: No orders of the defined types were placed in this encounter.   Signed, Georgeanna Lea, MD, [facility] 03/15/2023 1:42 PM    [facility]

## 2023-03-16 ENCOUNTER — Telehealth: Payer: Self-pay | Admitting: Cardiology

## 2023-03-16 LAB — COMPREHENSIVE METABOLIC PANEL
ALT: 9 [IU]/L (ref 0–32)
AST: 14 [IU]/L (ref 0–40)
Albumin: 4.3 g/dL (ref 3.8–4.8)
Alkaline Phosphatase: 80 [IU]/L (ref 44–121)
BUN/Creatinine Ratio: 18 (ref 12–28)
BUN: 24 mg/dL (ref 8–27)
Bilirubin Total: 0.3 mg/dL (ref 0.0–1.2)
CO2: 19 mmol/L — ABNORMAL LOW (ref 20–29)
Calcium: 9.4 mg/dL (ref 8.7–10.3)
Chloride: 98 mmol/L (ref 96–106)
Creatinine, Ser: 1.32 mg/dL — ABNORMAL HIGH (ref 0.57–1.00)
Globulin, Total: 2.5 g/dL (ref 1.5–4.5)
Glucose: 167 mg/dL — ABNORMAL HIGH (ref 70–99)
Potassium: 4.6 mmol/L (ref 3.5–5.2)
Sodium: 138 mmol/L (ref 134–144)
Total Protein: 6.8 g/dL (ref 6.0–8.5)
eGFR: 43 mL/min/{1.73_m2} — ABNORMAL LOW (ref >59)

## 2023-03-16 LAB — VITAMIN B12: Vitamin B-12: 362 pg/mL (ref 232–1245)

## 2023-03-16 LAB — PRO B NATRIURETIC PEPTIDE: NT-Pro BNP: 967 pg/mL — ABNORMAL HIGH (ref 0–301)

## 2023-03-16 LAB — VITAMIN D 25 HYDROXY (VIT D DEFICIENCY, FRACTURES): Vit D, 25-Hydroxy: 26 ng/mL — ABNORMAL LOW (ref 30.0–100.0)

## 2023-03-16 LAB — TSH: TSH: 0.121 u[IU]/mL — ABNORMAL LOW (ref 0.450–4.500)

## 2023-03-16 NOTE — Telephone Encounter (Signed)
LVM with EF 40% - same as before - per Dr. Vanetta Shawl note.

## 2023-03-16 NOTE — Telephone Encounter (Signed)
Pt came in for office visit yesterday and forgot to mention when he wanting to know what her current EF (Ejection fraction) is. Pt is requesting a callback to discuss. Please advise.

## 2023-03-17 ENCOUNTER — Telehealth: Payer: Self-pay | Admitting: Cardiology

## 2023-03-17 ENCOUNTER — Ambulatory Visit (HOSPITAL_BASED_OUTPATIENT_CLINIC_OR_DEPARTMENT_OTHER)
Admission: RE | Admit: 2023-03-17 | Discharge: 2023-03-17 | Discharge status: Home / Self Care | Payer: PPO | Source: Ambulatory Visit | Attending: Internal Medicine | Admitting: Radiology

## 2023-03-17 DIAGNOSIS — J984 Other disorders of lung: Secondary | ICD-10-CM | POA: Diagnosis not present

## 2023-03-17 DIAGNOSIS — I2510 Atherosclerotic heart disease of native coronary artery without angina pectoris: Secondary | ICD-10-CM

## 2023-03-17 DIAGNOSIS — I700 Atherosclerosis of aorta: Secondary | ICD-10-CM | POA: Diagnosis not present

## 2023-03-17 DIAGNOSIS — R0609 Other forms of dyspnea: Secondary | ICD-10-CM | POA: Diagnosis not present

## 2023-03-17 NOTE — Telephone Encounter (Signed)
Patient came in asking questions about getting a possible cath. CB # [phone]

## 2023-03-18 ENCOUNTER — Ambulatory Visit: Payer: PPO

## 2023-03-23 ENCOUNTER — Telehealth: Payer: Self-pay

## 2023-03-23 NOTE — Telephone Encounter (Signed)
 Patient came into the office wanting her CT chest results. Advised that they were not available and pt was upset stating I need these results to see if I need a cath. Notified [facility] who escalated the reading as the pt reports her sx are getting worse and she has an appt with Dr. Bernie 03/26/23.

## 2023-03-25 NOTE — Telephone Encounter (Signed)
 Spoke with pt regarding CT results. Advised that Dr. Gordan Latina would discuss and order Cardiac Cath at her appt tomorrow. Pt agreed and verbalized understanding. She had no further questions.

## 2023-03-26 ENCOUNTER — Other Ambulatory Visit: Payer: Self-pay

## 2023-03-26 ENCOUNTER — Encounter: Payer: Self-pay | Admitting: Cardiology

## 2023-03-26 ENCOUNTER — Ambulatory Visit: Payer: PPO | Attending: Cardiology | Admitting: Cardiology

## 2023-03-26 ENCOUNTER — Encounter (HOSPITAL_BASED_OUTPATIENT_CLINIC_OR_DEPARTMENT_OTHER): Payer: Self-pay | Admitting: Emergency Medicine

## 2023-03-26 ENCOUNTER — Observation Stay (HOSPITAL_BASED_OUTPATIENT_CLINIC_OR_DEPARTMENT_OTHER)
Admission: EM | Admit: 2023-03-26 | Discharge: 2023-03-28 | Disposition: A | Discharge status: Home / Self Care | Payer: PPO | Attending: Internal Medicine | Admitting: Internal Medicine

## 2023-03-26 VITALS — BP 100/58 | HR 86 | Ht 62.0 in | Wt 164.4 lb

## 2023-03-26 DIAGNOSIS — I5022 Chronic systolic (congestive) heart failure: Secondary | ICD-10-CM | POA: Insufficient documentation

## 2023-03-26 DIAGNOSIS — E66811 Obesity, class 1: Secondary | ICD-10-CM | POA: Diagnosis not present

## 2023-03-26 DIAGNOSIS — Z95810 Presence of automatic (implantable) cardiac defibrillator: Secondary | ICD-10-CM | POA: Diagnosis not present

## 2023-03-26 DIAGNOSIS — I10 Essential (primary) hypertension: Secondary | ICD-10-CM | POA: Diagnosis not present

## 2023-03-26 DIAGNOSIS — R7989 Other specified abnormal findings of blood chemistry: Secondary | ICD-10-CM | POA: Diagnosis present

## 2023-03-26 DIAGNOSIS — D649 Anemia, unspecified: Secondary | ICD-10-CM | POA: Diagnosis not present

## 2023-03-26 DIAGNOSIS — F1090 Alcohol use, unspecified, uncomplicated: Secondary | ICD-10-CM | POA: Insufficient documentation

## 2023-03-26 DIAGNOSIS — E11649 Type 2 diabetes mellitus with hypoglycemia without coma: Secondary | ICD-10-CM | POA: Insufficient documentation

## 2023-03-26 DIAGNOSIS — E1122 Type 2 diabetes mellitus with diabetic chronic kidney disease: Secondary | ICD-10-CM | POA: Diagnosis not present

## 2023-03-26 DIAGNOSIS — E039 Hypothyroidism, unspecified: Secondary | ICD-10-CM | POA: Diagnosis not present

## 2023-03-26 DIAGNOSIS — Z6830 Body mass index (BMI) 30.0-30.9, adult: Secondary | ICD-10-CM | POA: Diagnosis not present

## 2023-03-26 DIAGNOSIS — E119 Type 2 diabetes mellitus without complications: Secondary | ICD-10-CM

## 2023-03-26 DIAGNOSIS — Z86718 Personal history of other venous thrombosis and embolism: Secondary | ICD-10-CM | POA: Insufficient documentation

## 2023-03-26 DIAGNOSIS — I255 Ischemic cardiomyopathy: Secondary | ICD-10-CM

## 2023-03-26 DIAGNOSIS — I2510 Atherosclerotic heart disease of native coronary artery without angina pectoris: Secondary | ICD-10-CM | POA: Diagnosis not present

## 2023-03-26 DIAGNOSIS — F32A Depression, unspecified: Secondary | ICD-10-CM | POA: Diagnosis not present

## 2023-03-26 DIAGNOSIS — I429 Cardiomyopathy, unspecified: Secondary | ICD-10-CM

## 2023-03-26 DIAGNOSIS — Z79899 Other long term (current) drug therapy: Secondary | ICD-10-CM | POA: Insufficient documentation

## 2023-03-26 DIAGNOSIS — Z794 Long term (current) use of insulin: Secondary | ICD-10-CM

## 2023-03-26 DIAGNOSIS — I130 Hypertensive heart and chronic kidney disease with heart failure and stage 1 through stage 4 chronic kidney disease, or unspecified chronic kidney disease: Secondary | ICD-10-CM | POA: Insufficient documentation

## 2023-03-26 DIAGNOSIS — E079 Disorder of thyroid, unspecified: Secondary | ICD-10-CM | POA: Diagnosis present

## 2023-03-26 DIAGNOSIS — E785 Hyperlipidemia, unspecified: Secondary | ICD-10-CM | POA: Diagnosis not present

## 2023-03-26 DIAGNOSIS — N1832 Chronic kidney disease, stage 3b: Secondary | ICD-10-CM | POA: Diagnosis not present

## 2023-03-26 LAB — CBC WITH DIFFERENTIAL/PLATELET
Abs Immature Granulocytes: 0.06 10*3/uL (ref 0.00–0.07)
Basophils Absolute: 0.1 10*3/uL (ref 0.0–0.1)
Basophils Relative: 0 %
Eosinophils Absolute: 0.1 10*3/uL (ref 0.0–0.5)
Eosinophils Relative: 1 %
HCT: 22.7 % — ABNORMAL LOW (ref 36.0–46.0)
Hemoglobin: 6.6 g/dL — CL (ref 12.0–15.0)
Immature Granulocytes: 1 %
Lymphocytes Relative: 17 %
Lymphs Abs: 2 10*3/uL (ref 0.7–4.0)
MCH: 18.2 pg — ABNORMAL LOW (ref 26.0–34.0)
MCHC: 29.1 g/dL — ABNORMAL LOW (ref 30.0–36.0)
MCV: 62.5 fL — ABNORMAL LOW (ref 80.0–100.0)
Monocytes Absolute: 1.5 10*3/uL — ABNORMAL HIGH (ref 0.1–1.0)
Monocytes Relative: 13 %
Neutro Abs: 8.2 10*3/uL — ABNORMAL HIGH (ref 1.7–7.7)
Neutrophils Relative %: 68 %
Platelets: 380 10*3/uL (ref 150–400)
RBC: 3.63 MIL/uL — ABNORMAL LOW (ref 3.87–5.11)
RDW: 18.6 % — ABNORMAL HIGH (ref 11.5–15.5)
WBC: 11.9 10*3/uL — ABNORMAL HIGH (ref 4.0–10.5)
nRBC: 0 % (ref 0.0–0.2)

## 2023-03-26 LAB — COMPREHENSIVE METABOLIC PANEL
ALT: 12 U/L (ref 0–44)
AST: 16 U/L (ref 15–41)
Albumin: 3.6 g/dL (ref 3.5–5.0)
Alkaline Phosphatase: 59 U/L (ref 38–126)
Anion gap: 11 (ref 5–15)
BUN: 30 mg/dL — ABNORMAL HIGH (ref 8–23)
CO2: 21 mmol/L — ABNORMAL LOW (ref 22–32)
Calcium: 8.9 mg/dL (ref 8.9–10.3)
Chloride: 103 mmol/L (ref 98–111)
Creatinine, Ser: 1.43 mg/dL — ABNORMAL HIGH (ref 0.44–1.00)
GFR, Estimated: 39 mL/min — ABNORMAL LOW (ref >60)
Glucose, Bld: 182 mg/dL — ABNORMAL HIGH (ref 70–99)
Potassium: 4.8 mmol/L (ref 3.5–5.1)
Sodium: 135 mmol/L (ref 135–145)
Total Bilirubin: 0.6 mg/dL (ref 0.0–1.2)
Total Protein: 6.7 g/dL (ref 6.5–8.1)

## 2023-03-26 NOTE — Patient Instructions (Signed)
 Medication Instructions:   HOLD: Eliquis  - 2 days prior to procedure  HOLD: Furosemide  day of procedure  HOLD: Glimepiride  - day of procedure  HOLD: Glucophage  24 hours before procedure and 48 hours after procedure     *If you need a refill on your cardiac medications before your next appointment, please call your pharmacy*   Lab Work: CBC - today  If you have labs (blood work) drawn today and your tests are completely normal, you will receive your results only by: MyChart Message (if you have MyChart) OR A paper copy in the mail If you have any lab test that is abnormal or we need to change your treatment, we will call you to review the results.   Testing/Procedures:  [place] [facility] [place] [place] [address] [place] [address] Dept: [phone] Loc: [phone]  Evangelene COBI ALDAPE  03/26/2023  You are scheduled for a Cardiac Catheterization on Wednesday, February 12 with Dr. Ozell Fell.  1. Please arrive at the [facility] (Main Entrance A) at [facility]: [address] [place], [place] [address] at 8:00 AM (This time is 2 hour(s) before your procedure to ensure your preparation).   Free valet parking service is available. You will check in at ADMITTING. The support person will be asked to wait in the waiting room.  It is OK to have someone drop you off and come back when you are ready to be discharged.    Special note: Every effort is made to have your procedure done on time. Please understand that emergencies sometimes delay scheduled procedures.  2. Diet: Do not eat solid foods after midnight.  The patient may have clear liquids until 5am upon the day of the procedure.  3. Labs: You will need to have blood drawn on Friday, February 7 at [facility]: [address] [place], [place] . You do not need to be fasting.  4. Medication instructions in preparation for your procedure:    Contrast Allergy : No     Stop taking Eliquis  (Apixiban) on Monday, February 10.  Stop taking, Lasix  (Furosemide )  Wednesday, February 12,    Do not take Diabetes Med Glucophage  (Metformin ) on the day of the procedure and HOLD 48 HOURS AFTER THE PROCEDURE.  On the morning of your procedure, take your Aspirin 81 mg and any morning medicines NOT listed above.  You may use sips of water.  5. Plan to go home the same day, you will only stay overnight if medically necessary. 6. Bring a current list of your medications and current insurance cards. 7. You MUST have a responsible person to drive you home. 8. Someone MUST be with you the first 24 hours after you arrive home or your discharge will be delayed. 9. Please wear clothes that are easy to get on and off and wear slip-on shoes.  Thank you for allowing us  to care for you!   -- [place] [facility]    Follow-Up: At [facility], you and your health needs are our priority.  As part of our continuing mission to provide you with exceptional heart care, we have created designated Provider Care Teams.  These Care Teams include your primary Cardiologist (physician) and Advanced Practice Providers (APPs -  Physician Assistants and Nurse Practitioners) who all work together to provide you with the care you need, when you need it.  We recommend signing up for the patient portal  called MyChart.  Sign up information is provided on this After Visit Summary.  MyChart is used to connect with patients for Virtual Visits (Telemedicine).  Patients are able to view lab/test results, encounter notes, upcoming appointments, etc.  Non-urgent messages can be sent to your provider as well.   To learn more about what you can do with MyChart, go to [email or web address].    Your next appointment:   2 month(s)  The format for your next appointment:   In Person  Provider:   Lamar Fitch, MD   Other Instructions  Coronary  Angiogram With Stent Coronary angiogram with stent placement is a procedure to widen or open a narrow blood vessel of the heart (coronary artery). Arteries may become blocked by cholesterol buildup (plaques) in the lining of the artery wall. When a coronary artery becomes partially blocked, blood flow to that area decreases. This may lead to chest pain or a heart attack (myocardial infarction). A stent is a small piece of metal that looks like mesh or spring. Stent placement may be done as treatment after a heart attack, or to prevent a heart attack if a blocked artery is found by a coronary angiogram. Let your health care provider know about: Any allergies you have, including allergies to medicines or contrast dye. All medicines you are taking, including vitamins, herbs, eye drops, creams, and over-the-counter medicines. Any problems you or family members have had with anesthetic medicines. Any blood disorders you have. Any surgeries you have had. Any medical conditions you have, including kidney problems or kidney failure. Whether you are pregnant or may be pregnant. Whether you are breastfeeding. What are the risks? Generally, this is a safe procedure. However, serious problems may occur, including: Damage to nearby structures or organs, such as the heart, blood vessels, or kidneys. A return of blockage. Bleeding, infection, or bruising at the insertion site. A collection of blood under the skin (hematoma) at the insertion site. A blood clot in another part of the body. Allergic reaction to medicines or dyes. Bleeding into the abdomen (retroperitoneal bleeding). Stroke (rare). Heart attack (rare). What happens before the procedure? Staying hydrated Follow instructions from your health care provider about hydration, which may include: Up to 2 hours before the procedure - you may continue to drink clear liquids, such as water, clear fruit juice, black coffee, and plain tea.    Eating and  drinking restrictions Follow instructions from your health care provider about eating and drinking, which may include: 8 hours before the procedure - stop eating heavy meals or foods, such as meat, fried foods, or fatty foods. 6 hours before the procedure - stop eating light meals or foods, such as toast or cereal. 2 hours before the procedure - stop drinking clear liquids. Medicines Ask your health care provider about: Changing or stopping your regular medicines. This is especially important if you are taking diabetes medicines or blood thinners. Taking medicines such as aspirin and ibuprofen. These medicines can thin your blood. Do not take these medicines unless your health care provider tells you to take them. Generally, aspirin is recommended before a thin tube, called a catheter, is passed through a blood vessel and inserted into the heart (cardiac catheterization). Taking over-the-counter medicines, vitamins, herbs, and supplements. General instructions Do not use any products that contain nicotine or tobacco for at least 4 weeks before the procedure. These products include cigarettes, e-cigarettes, and chewing tobacco. If you need help quitting, ask your health care provider. Plan to have  someone take you home from the hospital or clinic. If you will be going home right after the procedure, plan to have someone with you for 24 hours. You may have tests and imaging procedures. Ask your health care provider: How your insertion site will be marked. Ask which artery will be used for the procedure. What steps will be taken to help prevent infection. These may include: Removing hair at the insertion site. Washing skin with a germ-killing soap. Taking antibiotic medicine. What happens during the procedure? An IV will be inserted into one of your veins. Electrodes may be placed on your chest to monitor your heart rate during the procedure. You will be given one or more of the following: A  medicine to help you relax (sedative). A medicine to numb the area (local anesthetic) for catheter insertion. A small incision will be made for catheter insertion. The catheter will be inserted into an artery using a guide wire. The location may be in your groin, your wrist, or the fold of your arm (near your elbow). An X-ray procedure (fluoroscopy) will be used to help guide the catheter to the opening of the heart arteries. A dye will be injected into the catheter. X-rays will be taken. The dye helps to show where any narrowing or blockages are located in the arteries. Tell your health care provider if you have chest pain or trouble breathing. A tiny wire will be guided to the blocked spot, and a balloon will be inflated to make the artery wider. The stent will be expanded to crush the plaques into the wall of the vessel. The stent will hold the area open and improve the blood flow. Most stents have a drug coating to reduce the risk of the stent narrowing over time. The artery may be made wider using a drill, laser, or other tools that remove plaques. The catheter will be removed when the blood flow improves. The stent will stay where it was placed, and the lining of the artery will grow over it. A bandage (dressing) will be placed on the insertion site. Pressure will be applied to stop bleeding. The IV will be removed. This procedure may vary among health care providers and hospitals.    What happens after the procedure? Your blood pressure, heart rate, breathing rate, and blood oxygen level will be monitored until you leave the hospital or clinic. If the procedure is done through the leg, you will lie flat in bed for a few hours or for as long as told by your health care provider. You will be instructed not to bend or cross your legs. The insertion site and the pulse in your foot or wrist will be checked often. You may have more blood tests, X-rays, and a test that records the electrical  activity of your heart (electrocardiogram, or ECG). Do not drive for 24 hours if you were given a sedative during your procedure. Summary Coronary angiogram with stent placement is a procedure to widen or open a narrowed coronary artery. This is done to treat heart problems. Before the procedure, let your health care provider know about all the medical conditions and surgeries you have or have had. This is a safe procedure. However, some problems may occur, including damage to nearby structures or organs, bleeding, blood clots, or allergies. Follow your health care provider's instructions about eating, drinking, medicines, and other lifestyle changes, such as quitting tobacco use before the procedure. This information is not intended to replace advice given to  you by your health care provider. Make sure you discuss any questions you have with your health care provider. Document Revised: 08/24/2018 Document Reviewed: 08/24/2018 Elsevier Patient Education  2021 [employer].

## 2023-03-26 NOTE — Progress Notes (Signed)
 Cardiology Office Note:    Date:  03/26/2023   ID:  Christina Perry, DOB [birth year], MRN [id number]  PCP:  Jefferey Fitch, MD  Cardiologist:  Lamar Fitch, MD    Referring MD: Jefferey Fitch, MD   Chief Complaint  Patient presents with   Discuss Cardiac Cath    History of Present Illness:    Christina Perry is a 73 y.o. female past medical history significant for coronary disease status post myocardial infarction 1996 with akinesis of apex with ejection fraction improved to 40%, essential hypertension, dyslipidemia, ICD Medtronic, LV thrombus.  She came to me last month complaining of feeling absolutely miserable she was in [place] she was unable to keep up with everything complaining of having some shortness of breath.  We end up doing stress test which was negative echocardiogram which showed no new findings, after that she got CT of the chest on it did not show any abnormality she comes today and she said she wants to have a cardiac catheterization.  She is a engineer, civil (consulting) working critical care associate exactly what she is talking about however she looks very pale today to me.  She also reports to me that she lost about 10 pounds within the last few months.  That was unintentional, that of course making very watery.  Past Medical History:  Diagnosis Date   Anxiety    Cardiomyopathy (HCC)    Coronary artery disease involving native coronary artery of native heart without angina pectoris 11/02/2014   Overview:  Large anterior myocardial infarction and age of 83   Coronary artery stenosis    Defibrillator discharge 11/02/2014   Formatting of this note might be different from the original. 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/27/2017   MI (myocardial infarction)  (HCC)    Thyroid  disease    Type 2 diabetes mellitus without complication (HCC) 11/02/2014    Past Surgical History:  Procedure Laterality Date   BARIATRIC SURGERY  2005   CAROTID STENT     CESAREAN SECTION  1979   CORONARY ANGIOPLASTY     ICD GENERATOR CHANGEOUT N/A 10/11/2017   Procedure: ICD GENERATOR CHANGEOUT;  Surgeon: Inocencio Soyla Lunger, MD;  Location: [facility] INVASIVE CV LAB;  Service: Cardiovascular;  Laterality: N/A;   ICD Placement     TONSILLECTOMY      Current Medications: Current Meds  Medication Sig   atorvastatin  (LIPITOR) 20 MG tablet Take 20 mg by mouth daily.   carvedilol  (COREG ) 12.5 MG tablet Take 1 tablet (12.5 mg total) by mouth 2 (two) times daily.   ELIQUIS  5 MG TABS tablet Take 1 tablet by mouth twice daily (Patient taking differently: Take 5 mg by mouth 2 (two) times daily.)   Estradiol (ESTRACE PO) Take 1 tablet by mouth daily.   EUTHYROX  200 MCG tablet Take 200 mcg by mouth every morning. Monday throught  Friday's   Fluticasone  Furoate-Vilanterol (BREO ELLIPTA  IN) Inhale 1 puff into the lungs in the morning.   furosemide  (LASIX ) 20 MG tablet Take 40 mg by mouth daily.   glimepiride  (AMARYL ) 4 MG tablet Take 4 mg by mouth daily.   Magnesium  500 MG TABS Take 500 mg by mouth 2 (two) times daily. 400-500 mg bid   metFORMIN  (GLUCOPHAGE ) 1000 MG tablet Take 1,000  mg by mouth 2 (two) times daily.   PARoxetine  (PAXIL ) 10 MG tablet Take 10 mg by mouth daily.   potassium chloride  (KLOR-CON ) 10 MEQ tablet Take 1 tablet (10 mEq total) by mouth daily.   ranolazine  (RANEXA ) 500 MG 12 hr tablet Take 1 tablet (500 mg total) by mouth 2 (two) times daily.     Allergies:   Bactrim [sulfamethoxazole-trimethoprim], Sulfa antibiotics, Cozaar  [losartan ], and Lisinopril   Social History   Socioeconomic History   Marital status: Married    Spouse name: Not on file   Number of children: Not on file   Years of education: Not on file   Highest education level: Not on file   Occupational History   Not on file  Tobacco Use   Smoking status: Never   Smokeless tobacco: Never  Vaping Use   Vaping status: Never Used  Substance and Sexual Activity   Alcohol  use: Yes    Comment: occ   Drug use: No   Sexual activity: Not on file  Other Topics Concern   Not on file  Social History Narrative   Not on file   Social Drivers of Health   Financial Resource Strain: Not on file  Food Insecurity: Not on file  Transportation Needs: Not on file  Physical Activity: Not on file  Stress: Not on file  Social Connections: Not on file     Family History: The patient's family history includes CAD in her mother; COPD in her mother; Diabetes in her maternal grandmother and mother; Stroke in her maternal grandmother and mother. ROS:   Please see the history of present illness.    All 14 point review of systems negative except as described per history of present illness  EKGs/Labs/Other Studies Reviewed:    EKG Interpretation Date/Time:  Friday March 26 2023 16:06:34 EST Ventricular Rate:  86 PR Interval:  176 QRS Duration:  102 QT Interval:  392 QTC Calculation: 469 R Axis:   266  Text Interpretation: Sinus rhythm with marked sinus arrhythmia Right superior axis deviation Abnormal ECG No previous ECGs available Confirmed by Bernie Charleston [phone]) on 03/26/2023 4:14:26 PM    Recent Labs: 03/15/2023: ALT 9; BUN 24; Creatinine, Ser 1.32; NT-Pro BNP 967; Potassium 4.6; Sodium 138; TSH 0.121  Recent Lipid Panel No results found for: CHOL, TRIG, HDL, CHOLHDL, VLDL, LDLCALC, LDLDIRECT  Physical Exam:    VS:  BP (!) 100/58 (BP Location: Left Arm, Patient Position: Sitting)   Pulse 86   Ht 5' 2 (1.575 m)   Wt 164 lb 6.4 oz (74.6 kg)   SpO2 98%   BMI 30.07 kg/m     Wt Readings from Last 3 Encounters:  03/26/23 164 lb 6.4 oz (74.6 kg)  03/15/23 165 lb 6.4 oz (75 kg)  02/15/23 171 lb 6.4 oz (77.7 kg)     GEN:  Well nourished, well developed  in no acute distress HEENT: Normal NECK: No JVD; No carotid bruits LYMPHATICS: No lymphadenopathy CARDIAC: RRR, no murmurs, no rubs, no gallops RESPIRATORY:  Clear to auscultation without rales, wheezing or rhonchi  ABDOMEN: Soft, non-tender, non-distended MUSCULOSKELETAL:  No edema; No deformity  SKIN: Warm and dry LOWER EXTREMITIES: no swelling NEUROLOGIC:  Alert and oriented x 3 PSYCHIATRIC:  Normal affect   ASSESSMENT:    1. Coronary artery disease involving native coronary artery of native heart without angina pectoris   2. Primary hypertension   3. Ischemic cardiomyopathy   4. Type 2 diabetes mellitus without complication, with  long-term current use of insulin  (HCC)   5. Dyslipidemia   6. ICD (implantable cardioverter-defibrillator) in place    PLAN:    In order of problems listed above:  Some constellation of atypical symptoms majority of it is shortness of breath fatigue.  She looks very pale to me the key will be to do stat CBC which I asked her to do today hopefully later will get results of it.  Will try to work up on getting her cardiac catheterization however if truly she is anemic that need to be obviously worked out.  She also lost some weight which make me worry about some process going on. Essential hypertension blood pressure well-controlled. Ischemic cardiomyopathy she is on guideline directed medical therapy. LV thrombus she is anticoagulated ask her not to take afternoon dose of her Eliquis  which we should have her hemoglobin and by the time tomorrow morning. ICD present normal function   Medication Adjustments/Labs and Tests Ordered: Current medicines are reviewed at length with the patient today.  Concerns regarding medicines are outlined above.  Orders Placed This Encounter  Procedures   CBC   EKG 12-Lead   Medication changes: No orders of the defined types were placed in this encounter.   Signed, Lamar DOROTHA Fitch, MD, [facility] 03/26/2023 5:09 PM     [facility]

## 2023-03-26 NOTE — ED Triage Notes (Addendum)
 Patient states that her PCP called her and told her that her HGB was 7.2 and to come to ED.  Patient endorses being pale and shortness of breath with exertion.  Patient denies rectal/vaginal bleeding. Patient does take eliquis .

## 2023-03-27 DIAGNOSIS — N1832 Chronic kidney disease, stage 3b: Secondary | ICD-10-CM | POA: Diagnosis not present

## 2023-03-27 DIAGNOSIS — R7989 Other specified abnormal findings of blood chemistry: Secondary | ICD-10-CM | POA: Diagnosis present

## 2023-03-27 DIAGNOSIS — D649 Anemia, unspecified: Principal | ICD-10-CM | POA: Diagnosis present

## 2023-03-27 DIAGNOSIS — E785 Hyperlipidemia, unspecified: Secondary | ICD-10-CM | POA: Diagnosis not present

## 2023-03-27 DIAGNOSIS — F1090 Alcohol use, unspecified, uncomplicated: Secondary | ICD-10-CM | POA: Diagnosis not present

## 2023-03-27 DIAGNOSIS — I5022 Chronic systolic (congestive) heart failure: Secondary | ICD-10-CM | POA: Diagnosis not present

## 2023-03-27 DIAGNOSIS — Z86718 Personal history of other venous thrombosis and embolism: Secondary | ICD-10-CM | POA: Diagnosis not present

## 2023-03-27 DIAGNOSIS — E1122 Type 2 diabetes mellitus with diabetic chronic kidney disease: Secondary | ICD-10-CM | POA: Diagnosis not present

## 2023-03-27 DIAGNOSIS — Z79899 Other long term (current) drug therapy: Secondary | ICD-10-CM | POA: Diagnosis not present

## 2023-03-27 DIAGNOSIS — I130 Hypertensive heart and chronic kidney disease with heart failure and stage 1 through stage 4 chronic kidney disease, or unspecified chronic kidney disease: Secondary | ICD-10-CM | POA: Diagnosis not present

## 2023-03-27 DIAGNOSIS — E66811 Obesity, class 1: Secondary | ICD-10-CM | POA: Diagnosis not present

## 2023-03-27 DIAGNOSIS — F32A Depression, unspecified: Secondary | ICD-10-CM | POA: Diagnosis not present

## 2023-03-27 DIAGNOSIS — Z6830 Body mass index (BMI) 30.0-30.9, adult: Secondary | ICD-10-CM | POA: Diagnosis not present

## 2023-03-27 DIAGNOSIS — E11649 Type 2 diabetes mellitus with hypoglycemia without coma: Secondary | ICD-10-CM | POA: Diagnosis not present

## 2023-03-27 DIAGNOSIS — Z794 Long term (current) use of insulin: Secondary | ICD-10-CM | POA: Diagnosis not present

## 2023-03-27 DIAGNOSIS — E039 Hypothyroidism, unspecified: Secondary | ICD-10-CM | POA: Diagnosis not present

## 2023-03-27 LAB — GLUCOSE, CAPILLARY
Glucose-Capillary: 265 mg/dL — ABNORMAL HIGH (ref 70–99)
Glucose-Capillary: 150 mg/dL — ABNORMAL HIGH (ref 70–99)
Glucose-Capillary: 122 mg/dL — ABNORMAL HIGH (ref 70–99)

## 2023-03-27 LAB — CBC
HCT: 19.5 % — ABNORMAL LOW (ref 36.0–46.0)
HCT: 30 % — ABNORMAL LOW (ref 36.0–46.0)
Hematocrit: 26.1 % — ABNORMAL LOW (ref 34.0–46.6)
Hemoglobin: 7.2 g/dL — ABNORMAL LOW (ref 11.1–15.9)
Hemoglobin: 5.9 g/dL — CL (ref 12.0–15.0)
Hemoglobin: 9.5 g/dL — ABNORMAL LOW (ref 12.0–15.0)
MCH: 18.6 pg — ABNORMAL LOW (ref 26.6–33.0)
MCH: 18.8 pg — ABNORMAL LOW (ref 26.0–34.0)
MCH: 21.6 pg — ABNORMAL LOW (ref 26.0–34.0)
MCHC: 27.6 g/dL — ABNORMAL LOW (ref 31.5–35.7)
MCHC: 30.3 g/dL (ref 30.0–36.0)
MCHC: 31.7 g/dL (ref 30.0–36.0)
MCV: 67 fL — ABNORMAL LOW (ref 79–97)
MCV: 62.1 fL — ABNORMAL LOW (ref 80.0–100.0)
MCV: 68.2 fL — ABNORMAL LOW (ref 80.0–100.0)
Platelets: 415 10*3/uL (ref 150–450)
Platelets: 318 10*3/uL (ref 150–400)
Platelets: 312 10*3/uL (ref 150–400)
RBC: 3.88 x10E6/uL (ref 3.77–5.28)
RBC: 3.14 MIL/uL — ABNORMAL LOW (ref 3.87–5.11)
RBC: 4.4 MIL/uL (ref 3.87–5.11)
RDW: 17.7 % — ABNORMAL HIGH (ref 11.7–15.4)
RDW: 18.4 % — ABNORMAL HIGH (ref 11.5–15.5)
RDW: 22.5 % — ABNORMAL HIGH (ref 11.5–15.5)
WBC: 11.2 10*3/uL — ABNORMAL HIGH (ref 3.4–10.8)
WBC: 9.8 10*3/uL (ref 4.0–10.5)
WBC: 10.8 10*3/uL — ABNORMAL HIGH (ref 4.0–10.5)
nRBC: 0 % (ref 0.0–0.2)
nRBC: 0 % (ref 0.0–0.2)

## 2023-03-27 LAB — CREATININE, SERUM
Creatinine, Ser: 1.3 mg/dL — ABNORMAL HIGH (ref 0.44–1.00)
GFR, Estimated: 43 mL/min — ABNORMAL LOW (ref >60)

## 2023-03-27 LAB — IRON AND TIBC
Iron: 9 ug/dL — ABNORMAL LOW (ref 28–170)
Saturation Ratios: 2 % — ABNORMAL LOW (ref 10.4–31.8)
TIBC: 451 ug/dL — ABNORMAL HIGH (ref 250–450)
UIBC: 442 ug/dL

## 2023-03-27 LAB — RETICULOCYTES
Immature Retic Fract: 30.6 % — ABNORMAL HIGH (ref 2.3–15.9)
RBC.: 3.58 MIL/uL — ABNORMAL LOW (ref 3.87–5.11)
Retic Count, Absolute: 48 10*3/uL (ref 19.0–186.0)
Retic Ct Pct: 1.3 % (ref 0.4–3.1)

## 2023-03-27 LAB — OCCULT BLOOD X 1 CARD TO LAB, STOOL: Fecal Occult Bld: NEGATIVE

## 2023-03-27 LAB — VITAMIN B12: Vitamin B-12: 171 pg/mL — ABNORMAL LOW (ref 180–914)

## 2023-03-27 LAB — PREPARE RBC (CROSSMATCH)

## 2023-03-27 LAB — FERRITIN: Ferritin: 4 ng/mL — ABNORMAL LOW (ref 11–307)

## 2023-03-27 LAB — HEMOGLOBIN A1C
Hgb A1c MFr Bld: 7 % — ABNORMAL HIGH (ref 4.8–5.6)
Mean Plasma Glucose: 154.2 mg/dL

## 2023-03-27 LAB — ABO/RH: ABO/RH(D): B POS

## 2023-03-27 LAB — FOLATE: Folate: 9.9 ng/mL (ref >5.9)

## 2023-03-27 MED ORDER — PAROXETINE HCL 10 MG PO TABS
10.0000 mg | ORAL_TABLET | Freq: Every day | ORAL | Status: DC
  Administered 2023-03-27 – 2023-03-28 (×2): 10 mg via ORAL
  Filled 2023-03-27 (×2): qty 1

## 2023-03-27 MED ORDER — GLIMEPIRIDE 4 MG PO TABS
4.0000 mg | ORAL_TABLET | Freq: Every day | ORAL | Status: DC
  Administered 2023-03-27 – 2023-03-28 (×2): 4 mg via ORAL
  Filled 2023-03-27 (×2): qty 1

## 2023-03-27 MED ORDER — CARVEDILOL 12.5 MG PO TABS
12.5000 mg | ORAL_TABLET | Freq: Two times a day (BID) | ORAL | Status: DC
  Administered 2023-03-27 – 2023-03-28 (×3): 12.5 mg via ORAL
  Filled 2023-03-27 (×3): qty 1

## 2023-03-27 MED ORDER — MAGNESIUM 200 MG PO TABS
400.0000 mg | ORAL_TABLET | Freq: Two times a day (BID) | ORAL | Status: DC
Start: 2023-03-27 — End: 2023-03-27
  Filled 2023-03-27: qty 2
  Filled 2023-03-27: qty 1
  Filled 2023-03-27: qty 2

## 2023-03-27 MED ORDER — HEPARIN SODIUM (PORCINE) 5000 UNIT/ML IJ SOLN: 5000.0000 [IU] | Freq: Three times a day (TID) | INTRAMUSCULAR | Status: DC

## 2023-03-27 MED ORDER — POLYETHYLENE GLYCOL 3350 17 G PO PACK: 17.0000 g | PACK | Freq: Every day | ORAL | Status: DC | PRN

## 2023-03-27 MED ORDER — INSULIN ASPART 100 UNIT/ML IJ SOLN: 0.0000 [IU] | Freq: Every day | INTRAMUSCULAR | Status: DC

## 2023-03-27 MED ORDER — RANOLAZINE ER 500 MG PO TB12
500.0000 mg | ORAL_TABLET | Freq: Two times a day (BID) | ORAL | Status: DC
  Administered 2023-03-27 – 2023-03-28 (×3): 500 mg via ORAL
  Filled 2023-03-27 (×4): qty 1

## 2023-03-27 MED ORDER — SODIUM CHLORIDE 0.9% IV SOLUTION: Freq: Once | INTRAVENOUS | Status: DC

## 2023-03-27 MED ORDER — FLUTICASONE FUROATE-VILANTEROL 100-25 MCG/ACT IN AEPB
1.0000 | INHALATION_SPRAY | Freq: Every morning | RESPIRATORY_TRACT | Status: DC
  Administered 2023-03-28: 1 via RESPIRATORY_TRACT
  Filled 2023-03-27: qty 28

## 2023-03-27 MED ORDER — INSULIN ASPART 100 UNIT/ML IJ SOLN
0.0000 [IU] | Freq: Three times a day (TID) | INTRAMUSCULAR | Status: DC
Start: 2023-03-27 — End: 2023-03-27

## 2023-03-27 MED ORDER — SODIUM CHLORIDE 0.9% IV SOLUTION: Freq: Once | INTRAVENOUS | Status: AC

## 2023-03-27 MED ORDER — MELATONIN 5 MG PO TABS
5.0000 mg | ORAL_TABLET | Freq: Every day | ORAL | Status: DC
  Administered 2023-03-27: 5 mg via ORAL
  Filled 2023-03-27: qty 1

## 2023-03-27 MED ORDER — METFORMIN HCL 500 MG PO TABS
1000.0000 mg | ORAL_TABLET | Freq: Two times a day (BID) | ORAL | Status: DC
  Administered 2023-03-27 – 2023-03-28 (×3): 1000 mg via ORAL
  Filled 2023-03-27 (×3): qty 2

## 2023-03-27 MED ORDER — LEVOTHYROXINE SODIUM 100 MCG PO TABS
200.0000 ug | ORAL_TABLET | Freq: Every morning | ORAL | Status: DC
  Administered 2023-03-27 – 2023-03-28 (×2): 200 ug via ORAL
  Filled 2023-03-27 (×2): qty 2

## 2023-03-27 MED ORDER — SODIUM CHLORIDE 0.9% FLUSH
3.0000 mL | Freq: Two times a day (BID) | INTRAVENOUS | Status: DC
  Administered 2023-03-27 – 2023-03-28 (×3): 3 mL via INTRAVENOUS

## 2023-03-27 MED ORDER — ACETAMINOPHEN 325 MG PO TABS: 650.0000 mg | ORAL_TABLET | Freq: Four times a day (QID) | ORAL | Status: DC | PRN

## 2023-03-27 MED ORDER — MAGNESIUM OXIDE -MG SUPPLEMENT 400 (240 MG) MG PO TABS
400.0000 mg | ORAL_TABLET | Freq: Two times a day (BID) | ORAL | Status: DC
  Administered 2023-03-27 – 2023-03-28 (×3): 400 mg via ORAL
  Filled 2023-03-27 (×2): qty 1

## 2023-03-27 MED ORDER — ATORVASTATIN CALCIUM 10 MG PO TABS
20.0000 mg | ORAL_TABLET | Freq: Every day | ORAL | Status: DC
Start: 2023-03-27 — End: 2023-03-28
  Administered 2023-03-27 – 2023-03-28 (×2): 20 mg via ORAL
  Filled 2023-03-27 (×2): qty 2

## 2023-03-27 MED ORDER — FUROSEMIDE 40 MG PO TABS
40.0000 mg | ORAL_TABLET | Freq: Every day | ORAL | Status: DC
  Administered 2023-03-27 – 2023-03-28 (×2): 40 mg via ORAL
  Filled 2023-03-27 (×2): qty 1

## 2023-03-27 MED ORDER — ACETAMINOPHEN 650 MG RE SUPP: 650.0000 mg | Freq: Four times a day (QID) | RECTAL | Status: DC | PRN

## 2023-03-27 MED ORDER — APIXABAN 5 MG PO TABS
5.0000 mg | ORAL_TABLET | Freq: Two times a day (BID) | ORAL | Status: DC
  Administered 2023-03-27 – 2023-03-28 (×2): 5 mg via ORAL
  Filled 2023-03-27 (×2): qty 1

## 2023-03-27 NOTE — Plan of Care (Signed)

## 2023-03-27 NOTE — Progress Notes (Addendum)
 Plan of Care Note for accepted transfer   Patient: Christina Perry MRN: [id number]   DOA: 03/26/2023  Facility requesting transfer: [facility]   Requesting Provider: Dr. Griselda   Reason for transfer: Symptomatic anemia   Facility course: 73 yr old female with history of HTN, HLD, DM, CKD 3, CAD, HFrEF with ICD, LV thrombus on Eliquis  who presents with insidiously worsening exertional dyspnea, pallor, and severe anemia on outpatient blood work. She has not noticed any bleeding.   Hemoglobin is 6.6 with negative FOBT, MCV 63, normal bilirubin, and normal platelets.   Plan of care: The patient is accepted for admission to Progressive unit, at [facility].   Author: Evalene GORMAN Sprinkles, MD 03/27/2023  Check [email or web address] for on-call coverage.  Nursing staff, Please call [facility] Admits & Consults System-Wide number on Amion as soon as patient's arrival, so appropriate admitting provider can evaluate the pt.

## 2023-03-27 NOTE — Assessment & Plan Note (Signed)
 This is chronically documented for the patient.  Continue with ranolazine , atorvastatin , Coreg , Lasix  40 daily.  Last echo on file is from January 16, LVEF is 40%.  Left ventricular hypertrophy.  Further optimization of medical treatment after anemia workup is complete.

## 2023-03-27 NOTE — Progress Notes (Signed)
 Patient admitted to 3W-06 from [facility]. Alert and oriented x 4. Denies any complaints. VSS. Admissions notified of patient's arrival. Will continue to monitor.

## 2023-03-27 NOTE — Assessment & Plan Note (Signed)
 As previously documented in the chart.  Patient advises to me that she takes Synthroid  200 mcg daily.  This does seem like a big dose for this lady.  I will check TSH and free T4 in the morning

## 2023-03-27 NOTE — ED Notes (Signed)
 [facility] called for transport.

## 2023-03-27 NOTE — ED Provider Notes (Signed)
 [place] [facility] AT [facility] Provider Note   CSN: [id number] Arrival date & time: 03/26/23  2259     History  Chief Complaint  Patient presents with   Abnormal Labs    Christina Perry is a 73 y.o. female.  The history is provided by the patient and medical records.  Christina Perry is a 73 y.o. female who presents to the Emergency Department complaining of abnormal lab.  She presents to the emergency department upon referral from her cardiologist for evaluation of abnormal hemoglobin on outpatient lab testing.  She reports that she has been short of breath f since mid December when she noticed that she was having trouble walking any significant distance distance.  Symptoms have become acutely worse over the last few weeks.  She has chronic diarrhea and this is unchanged from baseline.  No bloody stool but her stool is occasionally dark.  She has chronic cough for several years but this has been worsening over the last few weeks.  No fever, chest pain.  She was scheduled for cardiac cath on Wednesday of this week due to her progressive symptoms.  When she saw her cardiologist she was found to be pale and a hemoglobin was checked, which returned low.  She has chronic left lower extremity edema and this is at her baseline.  Hx/o CAD s/p stent at 73yo, cholecystectomy, DM, on eliquis  due to clot in left ventricle. Not on estrogen.     Home Medications Prior to Admission medications   Medication Sig Start Date End Date Taking? Authorizing Provider  atorvastatin  (LIPITOR) 20 MG tablet Take 20 mg by mouth daily. 06/23/11   [provider]  carvedilol  (COREG ) 12.5 MG tablet Take 1 tablet (12.5 mg total) by mouth 2 (two) times daily. 01/09/19   Vannie Reche RAMAN, NP  ELIQUIS  5 MG TABS tablet Take 1 tablet by mouth twice daily Patient taking differently: Take 5 mg by mouth 2 (two) times daily. 09/11/19   Krasowski, Robert J, MD  Estradiol (ESTRACE PO) Take 1  tablet by mouth daily.    [provider]  EUTHYROX  200 MCG tablet Take 200 mcg by mouth every morning. Monday throught  Friday's 10/23/19   [provider]  Fluticasone  Furoate-Vilanterol (BREO ELLIPTA  IN) Inhale 1 puff into the lungs in the morning.    [provider]  furosemide  (LASIX ) 20 MG tablet Take 40 mg by mouth daily. 07/06/19   [provider]  glimepiride  (AMARYL ) 4 MG tablet Take 4 mg by mouth daily. 10/05/16   [provider]  Magnesium  500 MG TABS Take 500 mg by mouth 2 (two) times daily. 400-500 mg bid    [provider]  metFORMIN  (GLUCOPHAGE ) 1000 MG tablet Take 1,000 mg by mouth 2 (two) times daily. 09/17/15   [provider]  PARoxetine  (PAXIL ) 10 MG tablet Take 10 mg by mouth daily.    [provider]  potassium chloride  (KLOR-CON ) 10 MEQ tablet Take 1 tablet (10 mEq total) by mouth daily. 03/02/23 05/31/23  Krasowski, Robert J, MD  ranolazine  (RANEXA ) 500 MG 12 hr tablet Take 1 tablet (500 mg total) by mouth 2 (two) times daily. 03/15/23   Krasowski, Robert J, MD      Allergies    Bactrim [sulfamethoxazole-trimethoprim], Sulfa antibiotics, Cozaar  [losartan ], and Lisinopril    Review of Systems   Review of Systems  All other systems reviewed and are negative.   Physical Exam Updated Vital Signs BP ROLLEN)  120/47   Pulse 72   Temp 98.6 F (37 C) (Oral)   Resp 20   Wt 74.4 kg   SpO2 98%   BMI 30.00 kg/m  Physical Exam Vitals and nursing note reviewed.  Constitutional:      Appearance: She is well-developed.  HENT:     Head: Normocephalic and atraumatic.  Cardiovascular:     Rate and Rhythm: Normal rate and regular rhythm.     Heart sounds: No murmur heard. Pulmonary:     Effort: Pulmonary effort is normal. No respiratory distress.     Breath sounds: Normal breath sounds.  Abdominal:     Palpations: Abdomen is soft.     Tenderness: There is no abdominal tenderness. There is no guarding or  rebound.  Genitourinary:    Comments: Brown stool in rectal vault, no tenderness on DRE Musculoskeletal:        General: No tenderness.     Comments: Pitting edema to BLE left greater than right.   Skin:    General: Skin is warm and dry.  Neurological:     Mental Status: She is alert and oriented to person, place, and time.  Psychiatric:        Behavior: Behavior normal.     ED Results / Procedures / Treatments   Labs (all labs ordered are listed, but only abnormal results are displayed) Labs Reviewed  CBC WITH DIFFERENTIAL/PLATELET - Abnormal; Notable for the following components:      Result Value   WBC 11.9 (*)    RBC 3.63 (*)    Hemoglobin 6.6 (*)    HCT 22.7 (*)    MCV 62.5 (*)    MCH 18.2 (*)    MCHC 29.1 (*)    RDW 18.6 (*)    Neutro Abs 8.2 (*)    Monocytes Absolute 1.5 (*)    All other components within normal limits  COMPREHENSIVE METABOLIC PANEL - Abnormal; Notable for the following components:   CO2 21 (*)    Glucose, Bld 182 (*)    BUN 30 (*)    Creatinine, Ser 1.43 (*)    GFR, Estimated 39 (*)    All other components within normal limits  RETICULOCYTES - Abnormal; Notable for the following components:   RBC. 3.58 (*)    Immature Retic Fract 30.6 (*)    All other components within normal limits  OCCULT BLOOD X 1 CARD TO LAB, STOOL  VITAMIN B12  FOLATE  IRON AND TIBC  FERRITIN    EKG None  Radiology No results found.  Procedures Procedures    Medications Ordered in ED Medications - No data to display  ED Course/ Medical Decision Making/ A&P                                 Medical Decision Making Amount and/or Complexity of Data Reviewed Labs: ordered.  Risk Decision regarding hospitalization.   Patient on anticoagulation for history of ventricular thrombus, coronary artery disease status post stent here for evaluation of anemia on outpatient lab.  Lab value is not available in epic, per patient it was 7.2.  Today in the emergency  department is 6.6.  No evidence of active bleeding at this time but she is symptomatic.  Discussed recommendation for admission for ongoing workup, transfusion and she is in agreement with plan.  Blood is not available at this facility.  Plan to admit for ongoing  care and patient is in agreement with plan.  Hospitalist consulted for admission.        Final Clinical Impression(s) / ED Diagnoses Final diagnoses:  Symptomatic anemia    Rx / DC Orders ED Discharge Orders     None         Griselda Norris, MD 03/27/23 [phone]

## 2023-03-27 NOTE — Assessment & Plan Note (Signed)
 Chronically documented.  Continue with Paxil .

## 2023-03-27 NOTE — Plan of Care (Signed)

## 2023-03-27 NOTE — Assessment & Plan Note (Signed)
 Patient has previously had left ventricular thrombus.  At this time no evidence of bleeding.  Patient last dose of apixaban  was yesterday morning, restart this evening after PRBC transfusion.

## 2023-03-27 NOTE — Assessment & Plan Note (Signed)
 Patient has been symptomatic approximately 4 1 to 2 months with insidious shortness of breath.  Patient is noted to be microcytic hypochromic as well as rather low reticulocyte count given her degree of anemia.  At this time still pending ferritin iron panel as well as folate and B12.  Pending haptoglobin and serum electrophoresis.  I have consented the patient for 2 unit PRBC and ordered the same.  Discussed with RN at the bedside.  Maintain the patient on telemetry till her transfusion is complete.  I will order a CBC for this evening.

## 2023-03-27 NOTE — Assessment & Plan Note (Addendum)
 Initially planned to Hold metformin  and glimepiride  and Maintain on insulin  sliding scale for now. However, patient prefers no insulin . At this time, I will restart metformin  and glimepiride . Monitor glucsoe levels.

## 2023-03-27 NOTE — H&P (Addendum)
 History and Physical    Patient: Christina Perry FMW:[id number] DOB: 03-Jan-1951 DOA: 03/26/2023 DOS: the patient was seen and examined on 03/27/2023 PCP: Jefferey Fitch, MD  Patient coming from: Home sent by PCP office to [facility] and now at [facility] inpatient unit.  Chief Complaint:  Chief Complaint  Patient presents with   Abnormal Labs   HPI: Christina Perry is a 73 y.o. female with medical history significant of medical problems as listed below.  Patient is also on chronic anticoagulation for her remote history of left ventricular thrombus formation.  However patient denies any gum bleeding nosebleeding hematuria black liquid stool skin bruising etc.  Patient reports being in her usual state of health till approximately 1 to 2 months ago when she reports an insidious onset of exertional shortness of breath.  It was initially ascribed to her cardiac issues, however routine blood testing showed she will has having severe anemia.  Patient was directed to [facility] was [facility] yesterday.  Patient is now at [facility] inpatient unit in [place].  Patient denies any fever chest pain leg swelling palpitation loss of consciousness.  Patient has been eating regular diet at home. Review of Systems: As mentioned in the history of present illness. All other systems reviewed and are negative. 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 She has never used smokeless tobacco. She reports current alcohol  use. She reports that she does not use drugs.  Allergies  Allergen Reactions   Bactrim [Sulfamethoxazole-Trimethoprim]     Steven-johnson's syndrome    Sulfa Antibiotics     Steven-johnson's syndrome    Cozaar  [Losartan ] Other (See Comments)    Gave her asthma-like symptoms   Lisinopril Cough    Family History  Problem Relation Age of Onset   COPD Mother    Diabetes Mother    CAD Mother    Stroke Mother    Diabetes Maternal Grandmother    Stroke Maternal Grandmother     Prior to Admission medications   Medication Sig Start Date End Date Taking? Authorizing Provider  atorvastatin  (LIPITOR) 20 MG tablet Take 20 mg by mouth daily. 06/23/11   [provider]  carvedilol  (COREG ) 12.5 MG tablet Take 1 tablet (12.5 mg total) by mouth 2 (two) times  daily. 01/09/19   Vannie Reche RAMAN, NP  ELIQUIS  5 MG TABS tablet Take 1 tablet by mouth twice daily Patient taking differently: Take 5 mg by mouth 2 (two) times daily. 09/11/19   Krasowski, Robert J, MD  Estradiol (ESTRACE PO) Take 1 tablet by mouth daily.    [provider]  EUTHYROX  200 MCG tablet Take 200 mcg by mouth every morning. Monday throught  Friday's 10/23/19   [provider]  Fluticasone  Furoate-Vilanterol (BREO ELLIPTA  IN) Inhale 1 puff into the lungs in the morning.     [provider]  furosemide  (LASIX ) 20 MG tablet Take 40 mg by mouth daily. 07/06/19   [provider]  glimepiride  (AMARYL ) 4 MG tablet Take 4 mg by mouth daily. 10/05/16   [provider]  Magnesium  500 MG TABS Take 500 mg by mouth 2 (two) times daily. 400-500 mg bid    [provider]  metFORMIN  (GLUCOPHAGE ) 1000 MG tablet Take 1,000 mg by mouth 2 (two) times daily. 09/17/15   [provider]  PARoxetine  (PAXIL ) 10 MG tablet Take 10 mg by mouth daily.    [provider]  potassium chloride  (KLOR-CON ) 10 MEQ tablet Take 1 tablet (10 mEq total) by mouth daily. 03/02/23 05/31/23  Krasowski, Robert J, MD  ranolazine  (RANEXA ) 500 MG 12 hr tablet Take 1 tablet (500 mg total) by mouth 2 (two) times daily. 03/15/23   Bernie Lamar PARAS, MD    Physical Exam: Vitals:   03/27/23 0230 03/27/23 0245 03/27/23 0407 03/27/23 0759  BP: (!) 122/54 (!) 120/47 108/63 (!) 120/53  Pulse: 68 72 76 73  Resp: 18 20 18    Temp:   98.1 F (36.7 C) 98.1 F (36.7 C)  TempSrc:   Oral Oral  SpO2: 98% 98% 100% 99%  Weight:       General: Patient is alert and awake, appears to be in no distress interacting pleasantly Respiratory exam: Bilateral intravesicular Cardiovascular exam S1-S2 normal Abdomen all quadrants are soft nontender no organomegaly is noted Extremities warm without edema Patient RN in the room when patient checked for lymphadenopathy, no lymphadenopathy is appreciated. Data Reviewed:  Labs on Admission:  Results for orders placed or performed during the hospital encounter of 03/26/23 (from the past 24 hours)  CBC with Differential     Status: Abnormal   Collection Time: 03/26/23 11:18 PM  Result Value Ref Range   WBC 11.9 (H) 4.0 - 10.5 K/uL   RBC 3.63 (L) 3.87 - 5.11 MIL/uL   Hemoglobin 6.6 (LL) 12.0 - 15.0 g/dL   HCT 77.2 (L) 63.9 - 53.9 %   MCV 62.5 (L) 80.0 - 100.0 fL   MCH 18.2 (L) 26.0 - 34.0 pg   MCHC 29.1 (L) 30.0 - 36.0 g/dL   RDW  81.3 (H) 88.4 - 15.5 %   Platelets 380 150 - 400 K/uL   nRBC 0.0 0.0 - 0.2 %   Neutrophils Relative % 68 %   Neutro Abs 8.2 (H) 1.7 - 7.7 K/uL   Lymphocytes Relative 17 %   Lymphs Abs 2.0 0.7 - 4.0 K/uL   Monocytes Relative 13 %   Monocytes Absolute 1.5 (H) 0.1 - 1.0 K/uL   Eosinophils Relative 1 %   Eosinophils Absolute 0.1 0.0 - 0.5 K/uL   Basophils Relative 0 %   Basophils Absolute 0.1 0.0 - 0.1 K/uL   Immature Granulocytes 1 %   Abs Immature Granulocytes 0.06 0.00 - 0.07 K/uL  Comprehensive metabolic panel  Status: Abnormal   Collection Time: 03/26/23 11:18 PM  Result Value Ref Range   Sodium 135 135 - 145 mmol/L   Potassium 4.8 3.5 - 5.1 mmol/L   Chloride 103 98 - 111 mmol/L   CO2 21 (L) 22 - 32 mmol/L   Glucose, Bld 182 (H) 70 - 99 mg/dL   BUN 30 (H) 8 - 23 mg/dL   Creatinine, Ser 8.56 (H) 0.44 - 1.00 mg/dL   Calcium  8.9 8.9 - 10.3 mg/dL   Total Protein 6.7 6.5 - 8.1 g/dL   Albumin 3.6 3.5 - 5.0 g/dL   AST 16 15 - 41 U/L   ALT 12 0 - 44 U/L   Alkaline Phosphatase 59 38 - 126 U/L   Total Bilirubin 0.6 0.0 - 1.2 mg/dL   GFR, Estimated 39 (L) >60 mL/min   Anion gap 11 5 - 15  Reticulocytes     Status: Abnormal   Collection Time: 03/26/23 11:18 PM  Result Value Ref Range   Retic Ct Pct 1.3 0.4 - 3.1 %   RBC. 3.58 (L) 3.87 - 5.11 MIL/uL   Retic Count, Absolute 48.0 19.0 - 186.0 K/uL   Immature Retic Fract 30.6 (H) 2.3 - 15.9 %  Occult blood card to lab, stool     Status: None   Collection Time: 03/27/23 12:31 AM  Result Value Ref Range   Fecal Occult Bld NEGATIVE NEGATIVE  Type and screen [facility]     Status: None (Preliminary result)   Collection Time: 03/27/23  8:24 AM  Result Value Ref Range   ABO/RH(D) PENDING    Antibody Screen PENDING    Sample Expiration      03/30/2023,2359 Performed at [facility], [address] [place], [place] [address]   Prepare RBC (crossmatch)     Status: None   Collection Time: 03/27/23  8:25 AM   Result Value Ref Range   Order Confirmation      ORDER PROCESSED BY BLOOD BANK Performed at [facility], [address] [place], [place] [address]   CBC     Status: Abnormal   Collection Time: 03/27/23  8:25 AM  Result Value Ref Range   WBC 9.8 4.0 - 10.5 K/uL   RBC 3.14 (L) 3.87 - 5.11 MIL/uL   Hemoglobin 5.9 (LL) 12.0 - 15.0 g/dL   HCT 80.4 (L) 63.9 - 53.9 %   MCV 62.1 (L) 80.0 - 100.0 fL   MCH 18.8 (L) 26.0 - 34.0 pg   MCHC 30.3 30.0 - 36.0 g/dL   RDW 81.5 (H) 88.4 - 84.4 %   Platelets 318 150 - 400 K/uL   nRBC 0.0 0.0 - 0.2 %  Creatinine, serum     Status: Abnormal   Collection Time: 03/27/23  8:25 AM  Result Value Ref Range   Creatinine, Ser 1.30 (H) 0.44 - 1.00 mg/dL   GFR, Estimated 43 (L) >60 mL/min  ABO/Rh     Status: None   Collection Time: 03/27/23  8:32 AM  Result Value Ref Range   ABO/RH(D)      B POS Performed at [facility], [address] [place], [place] [address]    Basic Metabolic Panel: Recent Labs  Lab 03/26/23 2318 03/27/23 0825  NA 135  --   K 4.8  --   CL 103  --   CO2 21*  --   GLUCOSE 182*  --   BUN 30*  --  CREATININE 1.43* 1.30*  CALCIUM  8.9  --    Liver Function Tests: Recent Labs  Lab 03/26/23 2318  AST 16  ALT 12  ALKPHOS 59  BILITOT 0.6  PROT 6.7  ALBUMIN 3.6   No results for input(s): LIPASE, AMYLASE in the last 168 hours. No results for input(s): AMMONIA in the last 168 hours. CBC: Recent Labs  Lab 03/26/23 1636 03/26/23 2318 03/27/23 0825  WBC 11.2* 11.9* 9.8  NEUTROABS  --  8.2*  --   HGB 7.2* 6.6* 5.9*  HCT 26.1* 22.7* 19.5*  MCV 67* 62.5* 62.1*  PLT 415 380 318   Cardiac Enzymes: No results for input(s): CKTOTAL, CKMB, CKMBINDEX, TROPONINIHS in the last 168 hours.  BNP (last 3 results) Recent Labs    03/15/23 1413  PROBNP 967*   CBG: No results for input(s): GLUCAP in the last 168 hours.  Radiological Exams on Admission:   EKG: Independently reviewed. From  yestrerday. NSR     Assessment and Plan: * Symptomatic anemia Patient has been symptomatic approximately 4 1 to 2 months with insidious shortness of breath.  Patient is noted to be microcytic hypochromic as well as rather low reticulocyte count given her degree of anemia.  At this time still pending ferritin iron panel as well as folate and B12.  Pending haptoglobin and serum electrophoresis.  I have consented the patient for 2 unit PRBC and ordered the same.  Discussed with RN at the bedside.  Maintain the patient on telemetry till her transfusion is complete.  I will order a CBC for this evening.  Depression Chronically documented.  Continue with Paxil .  Thyroid  disease As previously documented in the chart.  Patient advises to me that she takes Synthroid  200 mcg daily.  This does seem like a big dose for this lady.  I will check TSH and free T4 in the morning  Type 2 diabetes mellitus without complication (HCC) Initially planned to Hold metformin  and glimepiride  and Maintain on insulin  sliding scale for now. However, patient prefers no insulin . At this time, I will restart metformin  and glimepiride . Monitor glucsoe levels.  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  At this time no evidence of bleeding.  Patient last dose of apixaban  was yesterday morning, restart this evening after PRBC transfusion.  Cardiomyopathy [facility]) This is chronically documented for the patient.  Continue with ranolazine , atorvastatin , Coreg , Lasix  40 daily.  Last echo on file is from January 16, LVEF is 40%.  Left ventricular hypertrophy.  Further optimization of medical treatment after anemia workup is complete.   Please review medication reconciliation after pharmacy input    Advance Care Planning:   Code Status: Full Code   Consults: none at this time.  Family Communication: per pateitn  Severity of Illness: The appropriate patient  status for this patient is OBSERVATION. Observation status is judged to be reasonable and necessary in order to provide the required intensity of service to ensure the patient's safety. The patient's presenting symptoms, physical exam findings, and initial radiographic and laboratory data in the context of their medical condition is felt to place them at decreased risk for further clinical deterioration. Furthermore, it is anticipated that the patient will be medically stable for discharge from the hospital within 2 midnights of admission.   Author: Jacqulyn Divine, MD 03/27/2023 9:10 AM  For on call review [email or web address].

## 2023-03-28 DIAGNOSIS — D649 Anemia, unspecified: Secondary | ICD-10-CM | POA: Diagnosis not present

## 2023-03-28 LAB — BASIC METABOLIC PANEL
Anion gap: 13 (ref 5–15)
BUN: 21 mg/dL (ref 8–23)
CO2: 22 mmol/L (ref 22–32)
Calcium: 9 mg/dL (ref 8.9–10.3)
Chloride: 103 mmol/L (ref 98–111)
Creatinine, Ser: 1.57 mg/dL — ABNORMAL HIGH (ref 0.44–1.00)
GFR, Estimated: 35 mL/min — ABNORMAL LOW (ref >60)
Glucose, Bld: 129 mg/dL — ABNORMAL HIGH (ref 70–99)
Potassium: 5 mmol/L (ref 3.5–5.1)
Sodium: 138 mmol/L (ref 135–145)

## 2023-03-28 LAB — BPAM RBC
Blood Product Expiration Date: 202503092359
Blood Product Expiration Date: 202503092359
ISSUE DATE / TIME: 202502081010
ISSUE DATE / TIME: 202502081316
Unit Type and Rh: 7300
Unit Type and Rh: 7300

## 2023-03-28 LAB — TYPE AND SCREEN
ABO/RH(D): B POS
Antibody Screen: NEGATIVE
Unit division: 0
Unit division: 0

## 2023-03-28 LAB — CBC
HCT: 29.3 % — ABNORMAL LOW (ref 36.0–46.0)
Hemoglobin: 9.3 g/dL — ABNORMAL LOW (ref 12.0–15.0)
MCH: 21.7 pg — ABNORMAL LOW (ref 26.0–34.0)
MCHC: 31.7 g/dL (ref 30.0–36.0)
MCV: 68.3 fL — ABNORMAL LOW (ref 80.0–100.0)
Platelets: 315 10*3/uL (ref 150–400)
RBC: 4.29 MIL/uL (ref 3.87–5.11)
RDW: 22.9 % — ABNORMAL HIGH (ref 11.5–15.5)
WBC: 10.2 10*3/uL (ref 4.0–10.5)
nRBC: 0 % (ref 0.0–0.2)

## 2023-03-28 LAB — PROTIME-INR
INR: 1.4 — ABNORMAL HIGH (ref 0.8–1.2)
Prothrombin Time: 17.6 s — ABNORMAL HIGH (ref 11.4–15.2)

## 2023-03-28 LAB — APTT: aPTT: 28 s (ref 24–36)

## 2023-03-28 LAB — TSH: TSH: 0.119 u[IU]/mL — ABNORMAL LOW (ref 0.350–4.500)

## 2023-03-28 LAB — GLUCOSE, CAPILLARY
Glucose-Capillary: 39 mg/dL — CL (ref 70–99)
Glucose-Capillary: 131 mg/dL — ABNORMAL HIGH (ref 70–99)

## 2023-03-28 LAB — T4, FREE: Free T4: 1.53 ng/dL — ABNORMAL HIGH (ref 0.61–1.12)

## 2023-03-28 NOTE — Plan of Care (Signed)

## 2023-03-28 NOTE — Discharge Summary (Signed)
 Physician Discharge Summary  Christina Perry FMW:[id number] DOB: 1950/06/12 DOA: 03/26/2023  PCP: Jefferey Fitch, MD  Admit date: 03/26/2023 Discharge date: 03/28/2023  Admitted From: Home Disposition: Home  Recommendations for Outpatient Follow-up:  Follow up with PCP in 1 week with repeat CBC/BMP Follow up in ED if symptoms worsen or new appear   Home Health: No Equipment/Devices: None  Discharge Condition: Stable CODE STATUS: Full Diet recommendation: Heart healthy/carb modified  Brief/Interim Summary: 73 year old female with past medical history of hypertension, hyperlipidemia, diabetes mellitus type 2, CKD stage IIIb, CAD, chronic systolic heart failure with ICD, 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  During hospitalization, she was transfused 2 units packed red cells.  Currently, she is feeling better, hemoglobin is 9.3 this morning.  She feels okay to go home today.  Discharge patient home today with outpatient follow-up with PCP.  Discharge Diagnoses:   Symptomatic anemia -Questionable cause.  Presented with hemoglobin of 6.6 as an outpatient.  Transfused 2 units packed red cells.  Hemoglobin 9.3 this morning.  Feels better and feels okay to go home today. -No overt signs of bleeding including GI bleeding. -Outpatient follow-up of CBC within a week with PCP.  Recommend outpatient evaluation and follow-up by GI and hematology.  Chronic systolic heart failure with ICD Hypertension Hyperlipidemia History of LV thrombus -Currently compensated.  Continue ranolazine , Coreg , Lasix , statin.  Outpatient follow-up with cardiology.  Continue diet and fluid restriction -Continue Eliquis   Hypothyroidism--continue levothyroxine   Depression -Continue paroxetine   Diabetes mellitus type 2 with hypoglycemia -Had episode of hypoglycemia this morning.  Hold glimepiride  and metformin   till reevaluation by PCP.  Carb modified diet.  Obesity class I -Outpatient follow-up    Discharge Instructions  Discharge Instructions     Diet - low sodium heart healthy   Complete by: As directed    Diet Carb Modified   Complete by: As directed    Increase activity slowly   Complete by: As directed       Allergies as of 03/28/2023       Reactions   Bactrim [sulfamethoxazole-trimethoprim]    Steven-johnson's syndrome   Sulfa Antibiotics    Steven-johnson's syndrome    Cozaar  [losartan ] Other (See Comments)   Gave her asthma-like symptoms   Lisinopril Cough        Medication List     STOP taking these medications    glimepiride  4 MG tablet Commonly known as: AMARYL    metFORMIN  1000 MG tablet Commonly known as: GLUCOPHAGE        TAKE these medications    atorvastatin  20 MG tablet Commonly known as: LIPITOR Take 20 mg by mouth daily.   BREO ELLIPTA  IN Inhale 1 puff into the lungs in the morning.   carvedilol  12.5 MG tablet Commonly known as: COREG  Take 1 tablet (12.5 mg total) by mouth 2 (two) times daily.   Eliquis  5 MG Tabs tablet Generic drug: apixaban  Take 1 tablet by mouth twice daily What changed: how much to take   ESTRACE PO Take 1 tablet by mouth daily.   Euthyrox  200 MCG tablet Generic drug: levothyroxine  Take 200 mcg by mouth every morning. Monday throught  Friday's   furosemide  20 MG tablet Commonly known as: LASIX  Take 40 mg by mouth daily.   Magnesium  500 MG Tabs Take 500 mg by mouth 2 (two) times daily. 400-500 mg bid   PARoxetine  10 MG tablet Commonly  known as: PAXIL  Take 10 mg by mouth daily.   potassium chloride  10 MEQ tablet Commonly known as: KLOR-CON  Take 1 tablet (10 mEq total) by mouth daily.   ranolazine  500 MG 12 hr tablet Commonly known as: RANEXA  Take 1 tablet (500 mg total) by mouth 2 (two) times daily.        Follow-up Information     Prochnau, Aleck, MD. Schedule an appointment as soon as  possible for a visit in 1 week(s).   Specialty: Internal Medicine Why: With repeat CBC/BMP Contact information: [address] [place] [address] [phone]                Allergies  Allergen Reactions   Bactrim [Sulfamethoxazole-Trimethoprim]     Steven-johnson's syndrome    Sulfa Antibiotics     Steven-johnson's syndrome    Cozaar  [Losartan ] Other (See Comments)    Gave her asthma-like symptoms   Lisinopril Cough    Consultations: None   Procedures/Studies: CT Chest High Resolution Result Date: 03/24/2023 CLINICAL DATA:  Dyspnea on exertion, coronary artery disease EXAM: CT CHEST WITHOUT CONTRAST TECHNIQUE: Multidetector CT imaging of the chest was performed following the standard protocol without intravenous contrast. High resolution imaging of the lungs, as well as inspiratory and expiratory imaging, was performed. RADIATION DOSE REDUCTION: This exam was performed according to the departmental dose-optimization program which includes automated exposure control, adjustment of the mA and/or kV according to patient size and/or use of iterative reconstruction technique. COMPARISON:  08/27/2022 FINDINGS: Cardiovascular: Aortic atherosclerosis. Left chest multilead pacer. Extensive three-vessel coronary artery calcifications and stents. Normal heart size. No pericardial effusion. Mediastinum/Nodes: No enlarged mediastinal, hilar, or axillary lymph nodes. Thyroid  gland, trachea, and esophagus demonstrate no significant findings. Lungs/Pleura: Minimal bandlike scarring of the lung bases. No evidence of fibrotic interstitial lung disease. Unchanged, benign nodule of the right lower lobe requiring no further follow-up or characterization (series 302, image 67). No pleural effusion or pneumothorax. Upper Abdomen: No acute abnormality. Postoperative findings of gastric resection. Musculoskeletal: No chest wall abnormality. No acute osseous findings. IMPRESSION: 1. Minimal bandlike scarring  of the lung bases. No evidence of fibrotic interstitial lung disease. No acute airspace disease. 2. Coronary artery disease. Aortic Atherosclerosis (ICD10-I70.0). Electronically Signed   By: Marolyn JONETTA Jaksch M.D.   On: 03/24/2023 08:29   ECHOCARDIOGRAM COMPLETE Result Date: 03/04/2023    ECHOCARDIOGRAM REPORT   Patient Name:   Christina Perry Date of Exam: 03/04/2023 Medical Rec #:  [id number]       Height:       62.0 in Accession #:    [id number]      Weight:       171.4 lb Date of Birth:  06-09-1950       BSA:          1.790 m Patient Age:    72 years        BP:           116/52 mmHg Patient Gender: F               HR:           66 bpm. Exam Location:  [place] Procedure: 2D Echo, Cardiac Doppler, Color Doppler and Intracardiac            Opacification Agent Indications:    Dyspnea on exertion [R06.09 (ICD-10-CM)]  History:        Patient has prior history of Echocardiogram examinations, most  recent 04/19/2022. Cardiomyopathy, CAD and Previous Myocardial                 Infarction, ICD; Risk Factors:Hypertension, Dyslipidemia and                 Diabetes.  Sonographer:    Charlie Jointer RDCS Referring Phys: [id number] LAMAR JINNY FITCH  Sonographer Comments: Technically difficult study due to poor echo windows. IMPRESSIONS  1. Left ventricular ejection fraction, by estimation, is 40%%. The left ventricle has normal function. The left ventricle has no regional wall motion abnormalities. There is mild concentric left ventricular hypertrophy. Left ventricular diastolic parameters were normal. Elevated left ventricular end-diastolic pressure.  2. Right ventricular systolic function is normal. The right ventricular size is normal.  3. Left atrial size was mildly dilated.  4. The mitral valve is degenerative. No evidence of mitral valve regurgitation. No evidence of mitral stenosis. Moderate mitral annular calcification.  5. The aortic valve is tricuspid. Aortic valve regurgitation is not visualized. Aortic  valve sclerosis is present, with no evidence of aortic valve stenosis.  6. The inferior vena cava is normal in size with greater than 50% respiratory variability, suggesting right atrial pressure of 3 mmHg. FINDINGS  Left Ventricle: Left ventricular ejection fraction, by estimation, is 40%%. The left ventricle has normal function. The left ventricle has no regional wall motion abnormalities. Definity  contrast agent was given IV to delineate the left ventricular endocardial borders. The left ventricular internal cavity size was normal in size. There is mild concentric left ventricular hypertrophy. Left ventricular diastolic parameters were normal. Elevated left ventricular end-diastolic pressure.  LV Wall Scoring: The mid and distal anterior septum, apical anterior segment, and apex are akinetic. The basal inferior segment is hypokinetic. The anterior wall, entire lateral wall, mid and distal inferior wall, basal anteroseptal segment, mid inferoseptal segment, and basal inferoseptal segment are normal. Right Ventricle: The right ventricular size is normal. No increase in right ventricular wall thickness. Right ventricular systolic function is normal. Left Atrium: Left atrial size was mildly dilated. Right Atrium: Right atrial size was normal in size. Pericardium: There is no evidence of pericardial effusion. Mitral Valve: The mitral valve is degenerative in appearance. There is mild thickening of the anterior and posterior mitral valve leaflet(s). Moderate mitral annular calcification. No evidence of mitral valve regurgitation. No evidence of mitral valve stenosis. Tricuspid Valve: The tricuspid valve is normal in structure. Tricuspid valve regurgitation is not demonstrated. No evidence of tricuspid stenosis. Aortic Valve: The aortic valve is tricuspid. Aortic valve regurgitation is not visualized. Aortic valve sclerosis is present, with no evidence of aortic valve stenosis. Pulmonic Valve: The pulmonic valve was  normal in structure. Pulmonic valve regurgitation is not visualized. No evidence of pulmonic stenosis. Aorta: The aortic arch was not well visualized and the aortic root and ascending aorta are structurally normal, with no evidence of dilitation. Venous: A normal flow pattern is recorded from the right upper pulmonary vein. The inferior vena cava is normal in size with greater than 50% respiratory variability, suggesting right atrial pressure of 3 mmHg. IAS/Shunts: No atrial level shunt detected by color flow Doppler.  LEFT VENTRICLE PLAX 2D LVIDd:         5.20 cm   Diastology LVIDs:         4.00 cm   LV e' medial:    5.66 cm/s LV PW:         1.00 cm   LV E/e' medial:  17.7 LV IVS:  1.00 cm   LV e' lateral:   7.29 cm/s LVOT diam:     1.90 cm   LV E/e' lateral: 13.7 LV SV:         68 LV SV Index:   38 LVOT Area:     2.84 cm  RIGHT VENTRICLE             IVC RV Basal diam:  3.30 cm     IVC diam: 1.60 cm RV Mid diam:    3.10 cm RV S prime:     11.60 cm/s TAPSE (M-mode): 2.5 cm LEFT ATRIUM             Index        RIGHT ATRIUM           Index LA diam:        4.30 cm 2.40 cm/m   RA Area:     15.80 cm LA Vol (A2C):   68.9 ml 38.48 ml/m  RA Volume:   42.40 ml  23.68 ml/m LA Vol (A4C):   57.1 ml 31.89 ml/m LA Biplane Vol: 63.8 ml 35.64 ml/m  AORTIC VALVE LVOT Vmax:   102.00 cm/s LVOT Vmean:  70.550 cm/s LVOT VTI:    0.242 m  AORTA Ao Root diam: 3.00 cm Ao Asc diam:  3.10 cm Ao Desc diam: 1.30 cm MITRAL VALVE MV Area (PHT): 4.18 cm     SHUNTS MV Decel Time: 182 msec     Systemic VTI:  0.24 m MV E velocity: 100.10 cm/s  Systemic Diam: 1.90 cm MV A velocity: 99.90 cm/s MV E/A ratio:  1.00 Redell Leiter MD Electronically signed by Redell Leiter MD Signature Date/Time: 03/04/2023/3:26:24 PM    Final       Subjective: Patient seen and examined at bedside.  Feels better and feels okay to go home today.  No fever, vomiting, chest pain reported.  Discharge Exam: Vitals:   03/28/23 0323 03/28/23 0801  BP: 96/62  (!) 111/35  Pulse: 62 63  Resp: 19 17  Temp: 98.3 F (36.8 C) 97.7 F (36.5 C)  SpO2: 99% 98%    General: Pt is alert, awake, not in acute distress.  On room air. Cardiovascular: rate controlled, S1/S2 + Respiratory: bilateral decreased breath sounds at bases with some scattered crackles Abdominal: Soft, NT, ND, bowel sounds + Extremities: Trace lower extremity edema; o cyanosis    The results of significant diagnostics from this hospitalization (including imaging, microbiology, ancillary and laboratory) are listed below for reference.     Microbiology: No results found for this or any previous visit (from the past 240 hours).   Labs: BNP (last 3 results) No results for input(s): BNP in the last 8760 hours. Basic Metabolic Panel: Recent Labs  Lab 03/26/23 2318 03/27/23 0825 03/28/23 0646  NA 135  --  138  K 4.8  --  5.0  CL 103  --  103  CO2 21*  --  22  GLUCOSE 182*  --  129*  BUN 30*  --  21  CREATININE 1.43* 1.30* 1.57*  CALCIUM  8.9  --  9.0   Liver Function Tests: Recent Labs  Lab 03/26/23 2318  AST 16  ALT 12  ALKPHOS 59  BILITOT 0.6  PROT 6.7  ALBUMIN 3.6   No results for input(s): LIPASE, AMYLASE in the last 168 hours. No results for input(s): AMMONIA in the last 168 hours. CBC: Recent Labs  Lab 03/26/23 1636 03/26/23 2318 03/27/23 0825 03/27/23 1822  03/28/23 0646  WBC 11.2* 11.9* 9.8 10.8* 10.2  NEUTROABS  --  8.2*  --   --   --   HGB 7.2* 6.6* 5.9* 9.5* 9.3*  HCT 26.1* 22.7* 19.5* 30.0* 29.3*  MCV 67* 62.5* 62.1* 68.2* 68.3*  PLT 415 380 318 312 315   Cardiac Enzymes: No results for input(s): CKTOTAL, CKMB, CKMBINDEX, TROPONINI in the last 168 hours. BNP: Invalid input(s): POCBNP CBG: Recent Labs  Lab 03/27/23 1148 03/27/23 1646 03/27/23 2121 03/28/23 0617 03/28/23 0649  GLUCAP 265* 150* 122* 39* 131*   D-Dimer No results for input(s): DDIMER in the last 72 hours. Hgb A1c Recent Labs    03/27/23 1822   HGBA1C 7.0*   Lipid Profile No results for input(s): CHOL, HDL, LDLCALC, TRIG, CHOLHDL, LDLDIRECT in the last 72 hours. Thyroid  function studies Recent Labs    03/28/23 0646  TSH 0.119*   Anemia work up Recent Labs    03/26/23 2318 03/27/23 0031  VITAMINB12  --  171*  FOLATE  --  9.9  FERRITIN  --  4*  TIBC  --  451*  IRON  --  9*  RETICCTPCT 1.3  --    Urinalysis No results found for: COLORURINE, APPEARANCEUR, LABSPEC, PHURINE, GLUCOSEU, HGBUR, BILIRUBINUR, KETONESUR, PROTEINUR, UROBILINOGEN, NITRITE, LEUKOCYTESUR Sepsis Labs Recent Labs  Lab 03/26/23 2318 03/27/23 0825 03/27/23 1822 03/28/23 0646  WBC 11.9* 9.8 10.8* 10.2   Microbiology No results found for this or any previous visit (from the past 240 hours).   Time coordinating discharge: 35 minutes  SIGNED:   Sophie Mao, MD  [facility] 03/28/2023, 9:57 AM

## 2023-03-28 NOTE — Progress Notes (Signed)
 Hypoglycemic Event  CBG: 39  Treatment: 8 oz juice/soda  Symptoms: None  Follow-up CBG: GEXB:[id number] CBG Result:131  Possible Reasons for Event: Unknown   Christina Perry

## 2023-03-28 NOTE — Care Management Obs Status (Signed)
 MEDICARE OBSERVATION STATUS NOTIFICATION   Patient Details  Name: Christina Perry MRN: [id number] Date of Birth: 14-Nov-1950   Medicare Observation Status Notification Given:  Yes    Omie Bickers, RN 03/28/2023, 8:38 AM

## 2023-03-29 ENCOUNTER — Telehealth: Payer: Self-pay

## 2023-03-29 DIAGNOSIS — E1169 Type 2 diabetes mellitus with other specified complication: Secondary | ICD-10-CM | POA: Diagnosis not present

## 2023-03-29 DIAGNOSIS — D649 Anemia, unspecified: Secondary | ICD-10-CM | POA: Diagnosis not present

## 2023-03-29 DIAGNOSIS — Z79899 Other long term (current) drug therapy: Secondary | ICD-10-CM | POA: Diagnosis not present

## 2023-03-29 DIAGNOSIS — Z6830 Body mass index (BMI) 30.0-30.9, adult: Secondary | ICD-10-CM | POA: Diagnosis not present

## 2023-03-29 LAB — HAPTOGLOBIN: Haptoglobin: 174 mg/dL (ref 42–346)

## 2023-03-29 NOTE — Telephone Encounter (Signed)
 Called to follow up with pt after weekend hospitalization. LVM to call back

## 2023-03-30 LAB — PROTEIN ELECTROPHORESIS, SERUM
A/G Ratio: 1.3 (ref 0.7–1.7)
Albumin ELP: 3.2 g/dL (ref 2.9–4.4)
Alpha-1-Globulin: 0.2 g/dL (ref 0.0–0.4)
Alpha-2-Globulin: 0.7 g/dL (ref 0.4–1.0)
Beta Globulin: 0.9 g/dL (ref 0.7–1.3)
Gamma Globulin: 0.7 g/dL (ref 0.4–1.8)
Globulin, Total: 2.5 g/dL (ref 2.2–3.9)
Total Protein ELP: 5.7 g/dL — ABNORMAL LOW (ref 6.0–8.5)

## 2023-03-31 ENCOUNTER — Encounter (HOSPITAL_COMMUNITY): Admission: RE | Payer: Self-pay | Source: Home / Self Care

## 2023-03-31 ENCOUNTER — Ambulatory Visit (HOSPITAL_COMMUNITY): Admission: RE | Admit: 2023-03-31 | Payer: PPO | Source: Home / Self Care | Admitting: Cardiovascular Disease

## 2023-03-31 SURGERY — RIGHT/LEFT HEART CATH AND CORONARY ANGIOGRAPHY
Anesthesia: LOCAL

## 2023-04-02 ENCOUNTER — Telehealth: Payer: Self-pay | Admitting: Hematology and Oncology

## 2023-04-02 NOTE — Telephone Encounter (Signed)
04/02/23 Spoke with patient and scheduled New Hem appt with kELLI MOSHER.

## 2023-04-05 DIAGNOSIS — D509 Iron deficiency anemia, unspecified: Secondary | ICD-10-CM | POA: Diagnosis not present

## 2023-04-06 ENCOUNTER — Telehealth: Payer: Self-pay | Admitting: Cardiology

## 2023-04-06 DIAGNOSIS — D649 Anemia, unspecified: Secondary | ICD-10-CM | POA: Diagnosis not present

## 2023-04-06 DIAGNOSIS — Z79899 Other long term (current) drug therapy: Secondary | ICD-10-CM | POA: Diagnosis not present

## 2023-04-06 NOTE — Telephone Encounter (Signed)
 Can I give this patient a letter to return to work with no restrictions? Please advise.

## 2023-04-06 NOTE — Telephone Encounter (Signed)
 Patient would like a return to work letter for 04/13/23 with no restrictions. Pt wants to come by the office to pick up on Friday. Please call pt when ready

## 2023-04-08 ENCOUNTER — Other Ambulatory Visit: Payer: Self-pay | Admitting: Hematology and Oncology

## 2023-04-08 ENCOUNTER — Ambulatory Visit: Payer: PPO | Admitting: Cardiology

## 2023-04-08 DIAGNOSIS — D509 Iron deficiency anemia, unspecified: Secondary | ICD-10-CM

## 2023-04-08 NOTE — Telephone Encounter (Signed)
 Spoke with pt. She stated that she had a CBC drawn on 04-02-23 with PCP. Will call to get results from Dr. Sudie Bailey.

## 2023-04-08 NOTE — Progress Notes (Signed)
 [facility] [address] [place],  [place]  [address] [phone]  Clinic Day:  04/09/2023   Referring physician: Philemon Kingdom, MD  Patient Care Team: Patient Care Team: Philemon Kingdom, MD as PCP - General (Internal Medicine) Regan Lemming, MD as PCP - Electrophysiology (Cardiology)   REASON FOR CONSULTATION:  Iron deficiency anemia  HISTORY OF PRESENT ILLNESS:  Christina Perry is a 73 y.o. female with iron deficiency anemia who is referred in consultation by Dr. Philemon Kingdom for assessment and management. The patient saw her cardiologist on February 7.  CBC revealed a hemoglobin of 7.2 with an MCV of 62.5, so she was referred to [facility] ER.  Her hemoglobin was 6.4 there, so she was transferred to [facility].  Her hemoglobin dropped to 5.9 with an MCV of 62.  She received 2 units of packed red blood cells.  Evaluation revealed iron deficiency with a serum iron of 9 micrograms/dL, TIBC 664 micrograms/dL, iron saturation 2% and ferritin 4 ng/mL.  She also had B12 deficiency with a B12 of 171 pg/mL.  Chemistries were unremarkable except for BUN 30, creatinine 1.43, estimated GFR 39 and glucose 182.  Folate was normal.  Reticulocytes were elevated.  Haptoglobin was normal.  Serum protein electrophoresis did not reveal a monoclonal protein.  TSH was 0.119 and T4, free 1.5.  PT/INR was 17.6/1.4.  Stool Hemoccult x 1 was negative.  Her hemoglobin improved and was 9.3 at discharge.  She saw Dr. Sudie Bailey on February 10 for hospital follow-up.  CBC revealed a hemoglobin of 9.9 with an MCV of 71.  WBCs 10.8, 66% neutrophils, 20% lymphocytes, 13% monocytes, 1% eosinophils, 1% basophils.  Platelets 397,000.  Dr. Sudie Bailey scheduled the patient for IV iron in the form of Feraheme as an outpatient, which she received on February 17.   The patient denies fatigue or persistent shortness of breath since transfusion. She denies pica to ice. She denies melena,  hematochezia or other overt blood loss.  She eats a regular diet.  She is on apixaban 5 mg twice daily.  She has never had an EGD or colonoscopy.  Past history: Diabetes, coronary artery disease, hypothyroidism, hyperlipidemia, congestive heart failure, atrial fibrillation, hypertension, cardiomyopathy, essential tremor, anxiety and depression.  History of abdominal abscess surgical incision, myocardial infarction age 64, fibrocystic breast disease.  Status post bariatric surgery Roux-en-Y, coronary angioplasty, carotid stent placement, pacemaker/defibrillator placement, cesarean section, cholecystectomy, tonsillectomy. She is overdue for mammogram.  CT chest in July 2024 for cough did not reveal any acute cardiopulmonary disease.  Diffuse coronary artery disease was seen.  3 mm nodule in the right lower lung lobe was stable dating back to 2019, compatible with a benign nodule.  Social history:  She has never smoked  She rarely drinks alcohol.  She denies other substance. She is married, with 1 biologic child and 1 adopted child. Born in [place], raised in [place], has lived in [place] for 12 years. 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   Family history: No known family history of anemia or other blood dyscrasia. No know history of malignancy. Mother underwent hysterectomy for "suspicious lesion" at age 45, but she has no other information.  REVIEW OF SYSTEMS:  Review of Systems  Constitutional:  Negative for appetite change, chills, fatigue, fever and unexpected weight change.  HENT:   Negative for lump/mass, mouth sores, nosebleeds, sore throat and trouble swallowing.   Respiratory:  Negative for  chest tightness, cough, hemoptysis and shortness of breath.   Cardiovascular:  Positive for leg swelling (left ankle, due to surgery). Negative for chest pain and palpitations.  Gastrointestinal:  Negative for abdominal distention, abdominal pain, blood in stool,  constipation, diarrhea, nausea, rectal pain and vomiting.  Genitourinary:  Negative for difficulty urinating, dysuria, frequency, hematuria, pelvic pain, vaginal bleeding and vaginal discharge.   Musculoskeletal:  Negative for arthralgias, back pain, gait problem, myalgias and neck pain.  Skin:  Negative for itching, rash and wound.  Neurological:  Negative for dizziness, extremity weakness, gait problem, headaches, light-headedness and numbness.  Hematological:  Negative for adenopathy. Does not bruise/bleed easily.  Psychiatric/Behavioral:  Negative for depression and sleep disturbance. The patient is not nervous/anxious.      VITALS:  Blood pressure (!) 91/45, pulse 65, temperature 97.8 F (36.6 C), temperature source Oral, resp. rate 18, height 5\' 2"  (1.575 m), weight 163 lb (73.9 kg), SpO2 100%.  Wt Readings from Last 3 Encounters:  04/09/23 163 lb (73.9 kg)  03/26/23 164 lb (74.4 kg)  03/26/23 164 lb 6.4 oz (74.6 kg)    Body mass index is 29.81 kg/m.  Performance status (ECOG): 0 - Asymptomatic  PHYSICAL EXAM:  Physical Exam Vitals and nursing note reviewed.  Constitutional:      General: She is not in acute distress.    Appearance: Normal appearance. She is not ill-appearing.  HENT:     Head: Normocephalic and atraumatic.     Mouth/Throat:     Mouth: Mucous membranes are moist.     Pharynx: Oropharynx is clear. No oropharyngeal exudate or posterior oropharyngeal erythema.  Eyes:     General: No scleral icterus.    Extraocular Movements: Extraocular movements intact.     Conjunctiva/sclera: Conjunctivae normal.     Pupils: Pupils are equal, round, and reactive to light.  Cardiovascular:     Rate and Rhythm: Normal rate and regular rhythm.     Heart sounds: Normal heart sounds. No murmur heard.    No friction rub. No gallop.  Pulmonary:     Effort: Pulmonary effort is normal.     Breath sounds: Normal breath sounds. No wheezing, rhonchi or rales.  Abdominal:      General: There is no distension.     Palpations: Abdomen is soft. There is no hepatomegaly, splenomegaly or mass.     Tenderness: There is no abdominal tenderness.     Comments: Closed wound of the right upper quadrant with surrounding fibrosis  Musculoskeletal:        General: Normal range of motion.     Cervical back: Normal range of motion and neck supple. No tenderness.     Right lower leg: No edema.     Left lower leg: No edema.  Lymphadenopathy:     Cervical: No cervical adenopathy.     Upper Body:     Right upper body: No supraclavicular or axillary adenopathy.     Left upper body: No supraclavicular or axillary adenopathy.     Lower Body: No right inguinal adenopathy. No left inguinal adenopathy.  Skin:    General: Skin is warm and dry.     Coloration: Skin is pale. Skin is not jaundiced.     Findings: No rash.  Neurological:     Mental Status: She is alert and oriented to person, place, and time.     Cranial Nerves: No cranial nerve deficit.  Psychiatric:        Mood and Affect: Mood  normal.        Behavior: Behavior normal.        Thought Content: Thought content normal.      LABS:      Latest Ref Rng & Units 04/09/2023    9:01 AM 03/28/2023    6:46 AM 03/27/2023    6:22 PM  CBC  WBC 4.0 - 10.5 K/uL 9.5  10.2  10.8   Hemoglobin 12.0 - 15.0 g/dL 9.5  9.3  9.5   Hematocrit 36.0 - 46.0 % 30.9  29.3  30.0   Platelets 150 - 400 K/uL 317  315  312     Latest Reference Range & Units 04/09/23 09:01  MCV 80.0 - 100.0 fL 71.4 (L)  MCH 26.0 - 34.0 pg 21.9 (L)  MCHC 30.0 - 36.0 g/dL 16.1  RDW 09.6 - 04.5 % 27.9 (H)  (L): Data is abnormally low (H): Data is abnormally high   Latest Reference Range & Units 04/09/23 09:01  Neutrophils % 66  Lymphocytes % 18  Monocytes Relative % 13  Eosinophil % 2  Basophil % 1  Immature Granulocytes % 0  NEUT# 1.7 - 7.7 K/uL 6.3  Lymphs Abs 0.7 - 4.0 K/uL 1.7  Monocyte # 0.1 - 1.0 K/uL 1.2 (H)  Eosinophils Absolute 0.0 - 0.5 K/uL  0.2  Basophils Absolute 0.0 - 0.1 K/uL 0.1  Abs Immature Granulocytes 0.00 - 0.07 K/uL 0.04  (H): Data is abnormally high     Latest Ref Rng & Units 03/28/2023    6:46 AM 03/27/2023    8:25 AM 03/26/2023   11:18 PM  CMP  Glucose 70 - 99 mg/dL 409   811   BUN 8 - 23 mg/dL 21   30   Creatinine 9.14 - 1.00 mg/dL 7.82  9.56  2.13   Sodium 135 - 145 mmol/L 138   135   Potassium 3.5 - 5.1 mmol/L 5.0   4.8   Chloride 98 - 111 mmol/L 103   103   CO2 22 - 32 mmol/L 22   21   Calcium 8.9 - 10.3 mg/dL 9.0   8.9   Total Protein 6.5 - 8.1 g/dL   6.7   Total Bilirubin 0.0 - 1.2 mg/dL   0.6   Alkaline Phos 38 - 126 U/L   59   AST 15 - 41 U/L   16   ALT 0 - 44 U/L   12     Lab Results  Component Value Date   TOTALPROTELP 5.7 (L) 03/27/2023   ALBUMINELP 3.2 03/27/2023   A1GS 0.2 03/27/2023   A2GS 0.7 03/27/2023   BETS 0.9 03/27/2023   GAMS 0.7 03/27/2023   MSPIKE Not Observed 03/27/2023   SPEI Comment 03/27/2023   Lab Results  Component Value Date   TIBC 451 (H) 03/27/2023   FERRITIN 4 (L) 03/27/2023   IRONPCTSAT 2 (L) 03/27/2023     STUDIES:  CT Chest High Resolution Result Date: 03/24/2023 CLINICAL DATA:  Dyspnea on exertion, coronary artery disease EXAM: CT CHEST WITHOUT CONTRAST TECHNIQUE: Multidetector CT imaging of the chest was performed following the standard protocol without intravenous contrast. High resolution imaging of the lungs, as well as inspiratory and expiratory imaging, was performed. RADIATION DOSE REDUCTION: This exam was performed according to the departmental dose-optimization program which includes automated exposure control, adjustment of the mA and/or kV according to patient size and/or use of iterative reconstruction technique. COMPARISON:  08/27/2022 FINDINGS: Cardiovascular: Aortic atherosclerosis. Left chest multilead pacer.  Extensive three-vessel coronary artery calcifications and stents. Normal heart size. No pericardial effusion. Mediastinum/Nodes: No enlarged  mediastinal, hilar, or axillary lymph nodes. Thyroid gland, trachea, and esophagus demonstrate no significant findings. Lungs/Pleura: Minimal bandlike scarring of the lung bases. No evidence of fibrotic interstitial lung disease. Unchanged, benign nodule of the right lower lobe requiring no further follow-up or characterization (series 302, image 67). No pleural effusion or pneumothorax. Upper Abdomen: No acute abnormality. Postoperative findings of gastric resection. Musculoskeletal: No chest wall abnormality. No acute osseous findings. IMPRESSION: 1. Minimal bandlike scarring of the lung bases. No evidence of fibrotic interstitial lung disease. No acute airspace disease. 2. Coronary artery disease. Aortic Atherosclerosis (ICD10-I70.0). Electronically Signed   By: Jearld Lesch M.D.   On: 03/24/2023 08:29      HISTORY:   Past Medical History:  Diagnosis Date   Anemia    Anxiety    Atrial fib/flutter, transient (HCC)    Cardiomyopathy (HCC)    CHF (congestive heart failure) (HCC)    Coronary artery disease involving native coronary artery of native heart without angina pectoris 11/02/2014   Overview:  Large anterior myocardial infarction and age of 56   Coronary artery stenosis    Defibrillator discharge 11/02/2014   Formatting of this note might be different from the original. 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   MI (myocardial infarction) [facility])    Thyroid disease    Type 2 diabetes mellitus without complication (HCC) 11/02/2014    Past Surgical History:  Procedure Laterality Date   BARIATRIC SURGERY  2005   CAROTID STENT     CESAREAN SECTION  1979   CORONARY ANGIOPLASTY     ICD GENERATOR CHANGEOUT N/A  10/11/2017   Procedure: ICD GENERATOR CHANGEOUT;  Surgeon: Regan Lemming, MD;  Location: [facility] INVASIVE CV LAB;  Service: Cardiovascular;  Laterality: N/A;   ICD Placement     TONSILLECTOMY      Family History  Problem Relation Age of Onset   COPD Mother    Diabetes Mother    CAD Mother    Stroke Mother    Diabetes Maternal Grandmother    Stroke Maternal Grandmother     Social History:  reports that she has never smoked. She has never used smokeless tobacco. She reports current alcohol use. She reports that she does not use drugs.The patient is accompanied by her husband today.  Allergies:  Allergies  Allergen Reactions   Bactrim [Sulfamethoxazole-Trimethoprim]     Steven-johnson's syndrome    Sulfa Antibiotics     Steven-johnson's syndrome    Cozaar [Losartan] Other (See Comments)    Gave her asthma-like symptoms   Lisinopril Cough    Current Medications: Current Outpatient Medications  Medication Sig Dispense Refill   Aloe Vera 25 MG CAPS Take 1 capsule by mouth as needed.     apixaban (ELIQUIS) 5 MG TABS tablet Take 5 mg by mouth 2 (two) times daily.     glimepiride (AMARYL) 4 MG tablet Take 4 mg by mouth daily with breakfast. 1/2 tab     levothyroxine (SYNTHROID) 200 MCG tablet Take 200 mcg by mouth daily before breakfast.     Melatonin 5 MG CAPS Take 1 capsule by mouth at  bedtime as needed.     metFORMIN (GLUCOPHAGE) 1000 MG tablet Take 1,000 mg by mouth 2 (two) times daily.     nystatin powder Apply 1 Application topically as needed.     atorvastatin (LIPITOR) 20 MG tablet Take 20 mg by mouth daily.     carvedilol (COREG) 12.5 MG tablet Take 1 tablet (12.5 mg total) by mouth 2 (two) times daily. 180 tablet 1   estradiol (ESTRACE) 0.1 MG/GM vaginal cream Place 1 Applicatorful vaginally at bedtime. (Patient not taking: Reported on 04/09/2023)     Fluticasone Furoate-Vilanterol (BREO ELLIPTA IN) Inhale 1 puff into the lungs in the morning.     furosemide (LASIX) 20  MG tablet Take 40 mg by mouth daily.     Magnesium 500 MG TABS Take 500 mg by mouth 2 (two) times daily. 400-500 mg bid     PARoxetine (PAXIL) 10 MG tablet Take 10 mg by mouth daily.     potassium chloride (KLOR-CON) 10 MEQ tablet Take 1 tablet (10 mEq total) by mouth daily. 90 tablet 0   ranolazine (RANEXA) 500 MG 12 hr tablet Take 1 tablet (500 mg total) by mouth 2 (two) times daily. 180 tablet 1   No current facility-administered medications for this visit.     ASSESSMENT & PLAN:   Assessment:  Christina Perry is a 73 y.o. female with   Clear iron deficiency anemia. This is most likely due to GI blood loss.  This may be due to decreased absorption after gastric bypass, but her surgery was done 20 years ago and she has never been anemic before and has not taken vitamins regularly. Also, she is on apixaban, so at increased risk for bleeding. She received a dose of Feraheme on 04/09/23 at [facility], and is scheduled for her 2nd dose on 04/12/23. B12 deficiency likely due to decreased absorption due to gastric bypass.  Plan: Complete 2nd dose of Feraheme as scheduled.  I recommended referral to Dr. Jennye Boroughs for consideration of EDG and colonoscopy, but she declines at this time. I will plan to see her back in 6 weeks with a CBC, iron panel and ferritin. Continue oral B12 pending B12 level from today.  I discussed the assessment and plan with the patient and her husband.  They were provided an opportunity to ask questions and all were answered.  The patient agreed with the plan and demonstrated an understanding of the instructions.  She knows to contact our office if she develops symptoms of worsening anemia.  If she needs a blood transfusion, she will likely want to have this done at [facility].  Thank you for the referral    45 minutes was spent in patient care.  This included time spent preparing to see the patient (e.g., review of tests), obtaining and/or reviewing separately  obtained history, counseling and educating the patient/family/caregiver, ordering medications, tests, or procedures; documenting clinical information in the electronic or other health record, independently interpreting results and communicating results to the patient/family/caregiver as well as coordination of care.       Adah Perl, PA-C   Physician Assistant [facility] [place] [phone]

## 2023-04-09 ENCOUNTER — Telehealth: Payer: Self-pay | Admitting: Hematology and Oncology

## 2023-04-09 ENCOUNTER — Inpatient Hospital Stay: Payer: PPO | Attending: Hematology and Oncology | Admitting: Hematology and Oncology

## 2023-04-09 ENCOUNTER — Telehealth: Payer: Self-pay

## 2023-04-09 ENCOUNTER — Encounter: Payer: Self-pay | Admitting: Hematology and Oncology

## 2023-04-09 ENCOUNTER — Inpatient Hospital Stay: Payer: PPO

## 2023-04-09 VITALS — BP 91/45 | HR 65 | Temp 97.8°F | Resp 18 | Ht 62.0 in | Wt 163.0 lb

## 2023-04-09 DIAGNOSIS — D509 Iron deficiency anemia, unspecified: Secondary | ICD-10-CM | POA: Insufficient documentation

## 2023-04-09 DIAGNOSIS — Z9884 Bariatric surgery status: Secondary | ICD-10-CM | POA: Diagnosis not present

## 2023-04-09 DIAGNOSIS — E538 Deficiency of other specified B group vitamins: Secondary | ICD-10-CM | POA: Diagnosis not present

## 2023-04-09 LAB — CBC WITH DIFFERENTIAL (CANCER CENTER ONLY)
Abs Immature Granulocytes: 0.04 K/uL (ref 0.00–0.07)
Basophils Absolute: 0.1 K/uL (ref 0.0–0.1)
Basophils Relative: 1 %
Eosinophils Absolute: 0.2 K/uL (ref 0.0–0.5)
Eosinophils Relative: 2 %
HCT: 30.9 % — ABNORMAL LOW (ref 36.0–46.0)
Hemoglobin: 9.5 g/dL — ABNORMAL LOW (ref 12.0–15.0)
Immature Granulocytes: 0 %
Lymphocytes Relative: 18 %
Lymphs Abs: 1.7 K/uL (ref 0.7–4.0)
MCH: 21.9 pg — ABNORMAL LOW (ref 26.0–34.0)
MCHC: 30.7 g/dL (ref 30.0–36.0)
MCV: 71.4 fL — ABNORMAL LOW (ref 80.0–100.0)
Monocytes Absolute: 1.2 K/uL — ABNORMAL HIGH (ref 0.1–1.0)
Monocytes Relative: 13 %
Neutro Abs: 6.3 K/uL (ref 1.7–7.7)
Neutrophils Relative %: 66 %
Platelet Count: 317 K/uL (ref 150–400)
RBC: 4.33 MIL/uL (ref 3.87–5.11)
RDW: 27.9 % — ABNORMAL HIGH (ref 11.5–15.5)
WBC Count: 9.5 K/uL (ref 4.0–10.5)
nRBC: 0 % (ref 0.0–0.2)
nRBC: 0 /100{WBCs}

## 2023-04-09 LAB — SAMPLE TO BLOOD BANK

## 2023-04-09 LAB — VITAMIN B12: Vitamin B-12: 335 pg/mL (ref 180–914)

## 2023-04-09 NOTE — Telephone Encounter (Signed)
-----   Message from Adah Perl sent at 04/09/2023  3:45 PM EST ----- Please let her know her B12 has come up nicely with the oral supplement, so I recommend she continue that for now. Thanks

## 2023-04-09 NOTE — Telephone Encounter (Signed)
 Detailed message left for patient.

## 2023-04-09 NOTE — Telephone Encounter (Signed)
 Patient has been scheduled for follow-up visit per 04/09/23 LOS.  Pt given an appt calendar with date and time.

## 2023-04-09 NOTE — Telephone Encounter (Signed)
Work note printed for patient. 

## 2023-04-12 ENCOUNTER — Telehealth: Payer: Self-pay | Admitting: Cardiology

## 2023-04-12 NOTE — Telephone Encounter (Signed)
 Patient coming in today to ask Dr. Bing Matter "since the problem wasn't cardiac do we still need to do the [facility] 40mg  vs 20 mg that I was on previously and do we still need to be on Ranexa?"  Please call [phone] when able to speak with patient

## 2023-04-13 NOTE — Telephone Encounter (Signed)
 Recommendations reviewed with pt as per Dr. Madireddy's note.  Pt verbalized understanding and had no additional questions.

## 2023-04-15 DIAGNOSIS — L0291 Cutaneous abscess, unspecified: Secondary | ICD-10-CM | POA: Diagnosis not present

## 2023-04-16 DIAGNOSIS — L0291 Cutaneous abscess, unspecified: Secondary | ICD-10-CM | POA: Diagnosis not present

## 2023-04-19 DIAGNOSIS — H25813 Combined forms of age-related cataract, bilateral: Secondary | ICD-10-CM | POA: Diagnosis not present

## 2023-04-19 DIAGNOSIS — H524 Presbyopia: Secondary | ICD-10-CM | POA: Diagnosis not present

## 2023-04-19 DIAGNOSIS — E113291 Type 2 diabetes mellitus with mild nonproliferative diabetic retinopathy without macular edema, right eye: Secondary | ICD-10-CM | POA: Diagnosis not present

## 2023-04-20 ENCOUNTER — Ambulatory Visit (INDEPENDENT_AMBULATORY_CARE_PROVIDER_SITE_OTHER): Payer: HMO

## 2023-04-20 DIAGNOSIS — I255 Ischemic cardiomyopathy: Secondary | ICD-10-CM

## 2023-04-22 LAB — CUP PACEART REMOTE DEVICE CHECK
Battery Remaining Longevity: 26 mo
Battery Voltage: 2.93 V
Brady Statistic AP VP Percent: 0 %
Brady Statistic AP VS Percent: 19.59 %
Brady Statistic AS VP Percent: 0 %
Brady Statistic AS VS Percent: 80.41 %
Brady Statistic RA Percent Paced: 18.97 %
Brady Statistic RV Percent Paced: 0 %
Date Time Interrogation Session: 20250304001702
HighPow Impedance: 42 Ohm
HighPow Impedance: 54 Ohm
Implantable Lead Connection Status: 753985
Implantable Lead Connection Status: 753985
Implantable Lead Implant Date: 20111212
Implantable Lead Implant Date: 20111212
Implantable Lead Location: 753859
Implantable Lead Location: 753860
Implantable Lead Model: 5076
Implantable Lead Model: 6947
Implantable Pulse Generator Implant Date: 20190826
Lead Channel Impedance Value: 304 Ohm
Lead Channel Impedance Value: 399 Ohm
Lead Channel Impedance Value: 399 Ohm
Lead Channel Pacing Threshold Amplitude: 1.5 V
Lead Channel Pacing Threshold Pulse Width: 0.4 ms
Lead Channel Sensing Intrinsic Amplitude: 1.125 mV
Lead Channel Sensing Intrinsic Amplitude: 1.125 mV
Lead Channel Sensing Intrinsic Amplitude: 5.125 mV
Lead Channel Sensing Intrinsic Amplitude: 5.125 mV
Lead Channel Setting Pacing Amplitude: 2.5 V
Lead Channel Setting Pacing Amplitude: 5 V
Lead Channel Setting Pacing Pulse Width: 1 ms
Lead Channel Setting Sensing Sensitivity: 0.3 mV
Zone Setting Status: 755011
Zone Setting Status: 755011

## 2023-05-03 DIAGNOSIS — E785 Hyperlipidemia, unspecified: Secondary | ICD-10-CM | POA: Diagnosis not present

## 2023-05-03 DIAGNOSIS — E039 Hypothyroidism, unspecified: Secondary | ICD-10-CM | POA: Diagnosis not present

## 2023-05-03 DIAGNOSIS — I10 Essential (primary) hypertension: Secondary | ICD-10-CM | POA: Diagnosis not present

## 2023-05-03 DIAGNOSIS — D509 Iron deficiency anemia, unspecified: Secondary | ICD-10-CM | POA: Diagnosis not present

## 2023-05-03 DIAGNOSIS — E669 Obesity, unspecified: Secondary | ICD-10-CM | POA: Diagnosis not present

## 2023-05-03 DIAGNOSIS — E1169 Type 2 diabetes mellitus with other specified complication: Secondary | ICD-10-CM | POA: Diagnosis not present

## 2023-05-03 DIAGNOSIS — E538 Deficiency of other specified B group vitamins: Secondary | ICD-10-CM | POA: Diagnosis not present

## 2023-05-03 DIAGNOSIS — Z79899 Other long term (current) drug therapy: Secondary | ICD-10-CM | POA: Diagnosis not present

## 2023-05-04 DIAGNOSIS — L900 Lichen sclerosus et atrophicus: Secondary | ICD-10-CM | POA: Diagnosis not present

## 2023-05-20 ENCOUNTER — Other Ambulatory Visit: Payer: Self-pay | Admitting: Hematology and Oncology

## 2023-05-20 DIAGNOSIS — D519 Vitamin B12 deficiency anemia, unspecified: Secondary | ICD-10-CM

## 2023-05-20 DIAGNOSIS — D509 Iron deficiency anemia, unspecified: Secondary | ICD-10-CM

## 2023-05-21 ENCOUNTER — Other Ambulatory Visit: Payer: Self-pay

## 2023-05-21 ENCOUNTER — Inpatient Hospital Stay: Payer: PPO

## 2023-05-21 ENCOUNTER — Encounter: Payer: Self-pay | Admitting: Hematology and Oncology

## 2023-05-21 ENCOUNTER — Inpatient Hospital Stay: Payer: PPO | Attending: Hematology and Oncology | Admitting: Hematology and Oncology

## 2023-05-21 VITALS — BP 110/58 | HR 70 | Temp 98.8°F | Resp 18 | Ht 62.0 in | Wt 162.3 lb

## 2023-05-21 DIAGNOSIS — D509 Iron deficiency anemia, unspecified: Secondary | ICD-10-CM

## 2023-05-21 DIAGNOSIS — D500 Iron deficiency anemia secondary to blood loss (chronic): Secondary | ICD-10-CM | POA: Diagnosis not present

## 2023-05-21 DIAGNOSIS — Z7901 Long term (current) use of anticoagulants: Secondary | ICD-10-CM | POA: Diagnosis not present

## 2023-05-21 DIAGNOSIS — K922 Gastrointestinal hemorrhage, unspecified: Secondary | ICD-10-CM | POA: Diagnosis not present

## 2023-05-21 DIAGNOSIS — D518 Other vitamin B12 deficiency anemias: Secondary | ICD-10-CM | POA: Diagnosis not present

## 2023-05-21 DIAGNOSIS — Z9884 Bariatric surgery status: Secondary | ICD-10-CM | POA: Diagnosis not present

## 2023-05-21 DIAGNOSIS — D519 Vitamin B12 deficiency anemia, unspecified: Secondary | ICD-10-CM | POA: Diagnosis not present

## 2023-05-21 LAB — CBC WITH DIFFERENTIAL (CANCER CENTER ONLY)
Abs Immature Granulocytes: 0.02 10*3/uL (ref 0.00–0.07)
Basophils Absolute: 0 10*3/uL (ref 0.0–0.1)
Basophils Relative: 1 %
Eosinophils Absolute: 0.2 10*3/uL (ref 0.0–0.5)
Eosinophils Relative: 2 %
HCT: 37.5 % (ref 36.0–46.0)
Hemoglobin: 12.2 g/dL (ref 12.0–15.0)
Immature Granulocytes: 0 %
Immature Platelet Fraction: 1.7 % (ref 1.2–8.6)
Lymphocytes Relative: 19 %
Lymphs Abs: 1.7 10*3/uL (ref 0.7–4.0)
MCH: 27.1 pg (ref 26.0–34.0)
MCHC: 32.5 g/dL (ref 30.0–36.0)
MCV: 83.3 fL (ref 80.0–100.0)
Monocytes Absolute: 0.9 10*3/uL (ref 0.1–1.0)
Monocytes Relative: 11 %
Neutro Abs: 5.9 10*3/uL (ref 1.7–7.7)
Neutrophils Relative %: 67 %
Platelet Count: 236 10*3/uL (ref 150–400)
RBC: 4.5 MIL/uL (ref 3.87–5.11)
RDW: ABNORMAL % (ref 11.5–15.5)
WBC Count: 8.7 10*3/uL (ref 4.0–10.5)
nRBC: 0 % (ref 0.0–0.2)
nRBC: 0 /100{WBCs}

## 2023-05-21 LAB — CMP (CANCER CENTER ONLY)
ALT: 15 U/L (ref 0–44)
AST: 19 U/L (ref 15–41)
Albumin: 4.3 g/dL (ref 3.5–5.0)
Alkaline Phosphatase: 75 U/L (ref 38–126)
Anion gap: 12 (ref 5–15)
BUN: 25 mg/dL — ABNORMAL HIGH (ref 8–23)
CO2: 25 mmol/L (ref 22–32)
Calcium: 9.9 mg/dL (ref 8.9–10.3)
Chloride: 104 mmol/L (ref 98–111)
Creatinine: 1.15 mg/dL — ABNORMAL HIGH (ref 0.44–1.00)
GFR, Estimated: 50 mL/min — ABNORMAL LOW (ref >60)
Glucose, Bld: 112 mg/dL — ABNORMAL HIGH (ref 70–99)
Potassium: 5 mmol/L (ref 3.5–5.1)
Sodium: 141 mmol/L (ref 135–145)
Total Bilirubin: 0.2 mg/dL (ref 0.0–1.2)
Total Protein: 6.6 g/dL (ref 6.5–8.1)

## 2023-05-21 LAB — IRON AND TIBC
Iron: 72 ug/dL (ref 28–170)
Saturation Ratios: 21 % (ref 10.4–31.8)
TIBC: 347 ug/dL (ref 250–450)
UIBC: 275 ug/dL

## 2023-05-21 LAB — VITAMIN B12: Vitamin B-12: 535 pg/mL (ref 180–914)

## 2023-05-21 LAB — FERRITIN: Ferritin: 75 ng/mL (ref 11–307)

## 2023-05-21 LAB — FOLATE: Folate: 9.5 ng/mL (ref >5.9)

## 2023-05-21 NOTE — Progress Notes (Signed)
 [facility]  [address] [place],  [place]  [address] [phone]  Clinic Day:  05/21/2023  Referring physician: Philemon Kingdom, MD  ASSESSMENT & PLAN:   Assessment & Plan: Iron deficiency anemia due to chronic blood loss Iron deficiency anemia, felt to most likely due to GI blood loss.  This may be due to decreased absorption after gastric bypass, but her surgery was done 20 years ago and she has never been anemic before and has not taken vitamins regularly. Also, she is on apixaban, so at increased risk for bleeding. She completed Feraheme at [facility] in February.  She continues to decline referral to Dr. Jennye Boroughs for consideration of EGD and colonoscopy.  Her hemoglobin has normalized and iron stores adequate with IV iron. She would like to follow up with Dr. Sudie Bailey. I am glad to see her back in the future as needed.  B12 deficiency anemia B12 deficiency likely due to decreased absorption due to gastric bypass. She has responded to oral B12 supplement. I recommended she continue that.    The patient understands the plans discussed today and is in agreement with them.  She knows to contact our office if she develops concerns prior to her next appointment.   I provided 20 minutes of face-to-face time during this encounter and > 50% was spent counseling as documented under my assessment and plan.    Adah Perl, PA-C   CANCER CENTER [facility]  - A DEPT OF [facility] [address] [place] [address] Dept: [phone] Dept Fax: [phone]   Orders Placed This Encounter  Procedures   CMP (Cancer Center only)    Standing Status:   Future    Number of Occurrences:   1    Expected Date:   05/21/2023    Expiration Date:   05/20/2024      CHIEF COMPLAINT:  CC: Iron and B12 deficiency anemia  Current Treatment:  Oral B12  HISTORY OF PRESENT ILLNESS:  Edit Christina Perry is a 73 year old with iron deficiency  anemia. The patient saw her cardiologist on February 7.  CBC revealed a hemoglobin of 7.2 with an MCV of 62.5, so she was referred to [facility] ER.  Her hemoglobin was 6.4 there, so she was transferred to [facility].  Her hemoglobin dropped to 5.9 with an MCV of 62.  She received 2 units of packed red blood cells.  Evaluation revealed iron deficiency with a serum iron of 9 micrograms/dL, TIBC 132 micrograms/dL, iron saturation 2% and ferritin 4 ng/mL.  She also had B12 deficiency with a B12 of 171 pg/mL.  Dr. Sudie Bailey arranged for IV Feraheme as an outpatient in February.  The patient has declined referral to Dr. Jennye Boroughs for consideration of EGD and colonoscopy.  INTERVAL HISTORY:  Christina Perry is here today for repeat clinical assessment.  She states she is feeling much better since receiving IV iron.  She denies any overt form of blood loss.  She denies fevers or chills. She denies pain. Her appetite is good. Her weight has been stable.  She states Dr. Sudie Bailey requested we draw a CMP today due to worsening kidney function at her last visit.  Her BUN and creatinine have improved today.  REVIEW OF SYSTEMS:  Review of Systems  Constitutional:  Negative for appetite change, chills, fatigue, fever and unexpected weight change.  HENT:   Negative for lump/mass, mouth sores, nosebleeds and sore throat.   Respiratory:  Negative for  cough, hemoptysis and shortness of breath.   Cardiovascular:  Negative for chest pain and leg swelling.  Gastrointestinal:  Negative for abdominal pain, blood in stool, constipation, diarrhea, nausea and vomiting.  Endocrine: Negative for hot flashes.  Genitourinary:  Negative for difficulty urinating, dysuria, frequency, hematuria and vaginal bleeding.   Musculoskeletal:  Negative for arthralgias, back pain, gait problem and myalgias.  Skin:  Negative for rash.  Neurological:  Negative for dizziness, extremity weakness, gait problem, headaches, light-headedness and numbness.   Hematological:  Negative for adenopathy. Does not bruise/bleed easily.  Psychiatric/Behavioral:  Negative for depression and sleep disturbance. The patient is not nervous/anxious.      VITALS:  Blood pressure (!) 110/58, pulse 70, temperature 98.8 F (37.1 C), temperature source Oral, resp. rate 18, height 5\' 2"  (1.575 m), weight 162 lb 4.8 oz (73.6 kg), SpO2 100%.  Wt Readings from Last 3 Encounters:  05/21/23 162 lb 4.8 oz (73.6 kg)  04/09/23 163 lb (73.9 kg)  03/26/23 164 lb (74.4 kg)    Body mass index is 29.69 kg/m.  Performance status (ECOG): 0 - Asymptomatic  PHYSICAL EXAM:  Physical Exam Vitals and nursing note reviewed.  Constitutional:      General: She is not in acute distress.    Appearance: Normal appearance. She is not ill-appearing.  HENT:     Head: Normocephalic and atraumatic.     Mouth/Throat:     Mouth: Mucous membranes are moist.     Pharynx: Oropharynx is clear. No oropharyngeal exudate or posterior oropharyngeal erythema.  Eyes:     General: No scleral icterus.    Extraocular Movements: Extraocular movements intact.     Conjunctiva/sclera: Conjunctivae normal.     Pupils: Pupils are equal, round, and reactive to light.  Cardiovascular:     Rate and Rhythm: Normal rate and regular rhythm.     Heart sounds: Normal heart sounds. No murmur heard.    No friction rub. No gallop.  Pulmonary:     Effort: Pulmonary effort is normal.     Breath sounds: Normal breath sounds. No wheezing, rhonchi or rales.  Abdominal:     General: There is no distension.     Palpations: Abdomen is soft. There is no hepatomegaly, splenomegaly or mass.     Tenderness: There is no abdominal tenderness.  Musculoskeletal:        General: Normal range of motion.     Cervical back: Normal range of motion and neck supple. No tenderness.     Right lower leg: No edema.     Left lower leg: No edema.  Lymphadenopathy:     Cervical: No cervical adenopathy.     Upper Body:     Right  upper body: No supraclavicular or axillary adenopathy.     Left upper body: No supraclavicular or axillary adenopathy.     Lower Body: No right inguinal adenopathy. No left inguinal adenopathy.  Skin:    General: Skin is warm and dry.     Coloration: Skin is not jaundiced.     Findings: No rash.  Neurological:     Mental Status: She is alert and oriented to person, place, and time.     Cranial Nerves: No cranial nerve deficit.  Psychiatric:        Mood and Affect: Mood normal.        Behavior: Behavior normal.        Thought Content: Thought content normal.     LABS:  Latest Ref Rng & Units 05/21/2023    9:50 AM 04/09/2023    9:01 AM 03/28/2023    6:46 AM  CBC  WBC 4.0 - 10.5 K/uL 8.7  9.5  10.2   Hemoglobin 12.0 - 15.0 g/dL 14.7  9.5  9.3   Hematocrit 36.0 - 46.0 % 37.5  30.9  29.3   Platelets 150 - 400 K/uL 236  317  315       Latest Ref Rng & Units 05/21/2023    9:50 AM 03/28/2023    6:46 AM 03/27/2023    8:25 AM  CMP  Glucose 70 - 99 mg/dL 829  562    BUN 8 - 23 mg/dL 25  21    Creatinine 1.30 - 1.00 mg/dL 8.65  7.84  6.96   Sodium 135 - 145 mmol/L 141  138    Potassium 3.5 - 5.1 mmol/L 5.0  5.0    Chloride 98 - 111 mmol/L 104  103    CO2 22 - 32 mmol/L 25  22    Calcium 8.9 - 10.3 mg/dL 9.9  9.0    Total Protein 6.5 - 8.1 g/dL 6.6     Total Bilirubin 0.0 - 1.2 mg/dL 0.2     Alkaline Phos 38 - 126 U/L 75     AST 15 - 41 U/L 19     ALT 0 - 44 U/L 15       Lab Results  Component Value Date   TOTALPROTELP 5.7 (L) 03/27/2023   ALBUMINELP 3.2 03/27/2023   A1GS 0.2 03/27/2023   A2GS 0.7 03/27/2023   BETS 0.9 03/27/2023   GAMS 0.7 03/27/2023   MSPIKE Not Observed 03/27/2023   SPEI Comment 03/27/2023   Lab Results  Component Value Date   TIBC 347 05/21/2023   TIBC 451 (H) 03/27/2023   FERRITIN 75 05/21/2023   FERRITIN 4 (L) 03/27/2023   IRONPCTSAT 21 05/21/2023   IRONPCTSAT 2 (L) 03/27/2023     STUDIES:  No results found.    HISTORY:   Past Medical  History:  Diagnosis Date   Anemia    Anxiety    Atrial fib/flutter, transient (HCC)    Cardiomyopathy (HCC)    CHF (congestive heart failure) (HCC)    Coronary artery disease involving native coronary artery of native heart without angina pectoris 11/02/2014   Overview:  Large anterior myocardial infarction and age of 81   Coronary artery stenosis    Defibrillator discharge 11/02/2014   Formatting of this note might be different from the original. 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   MI (myocardial infarction) (HCC)    Thyroid disease    Type 2 diabetes mellitus without complication (HCC) 11/02/2014    Past Surgical History:  Procedure Laterality Date    left groin     enlarged lymph nodes removed/ cat scratch fever   BARIATRIC SURGERY  2005   CAROTID STENT     CESAREAN SECTION  1979   CHOLECYSTECTOMY     CORONARY ANGIOPLASTY     ICD GENERATOR CHANGEOUT N/A 10/11/2017   Procedure: ICD GENERATOR CHANGEOUT;  Surgeon: Regan Lemming, MD;  Location: [facility] INVASIVE CV LAB;  Service: Cardiovascular;  Laterality: N/A;  ICD Placement     INCISION AND DRAINAGE     abcess right abdomen   TONSILLECTOMY     US BREAST LEFT ([facility] HISTORICAL)     biopsy/ non cancerous    Family History  Problem Relation Age of Onset   COPD Mother    Diabetes Mother    CAD Mother    Stroke Mother    Heart attack Mother    CAD Maternal Grandmother    Diabetes Maternal Grandmother    Stroke Maternal Grandmother     Social History:  reports that she has never smoked. She has never used smokeless tobacco. She reports current alcohol use. She reports that she does not use drugs.The patient is accompanied by her husband  today.  Allergies:  Allergies  Allergen Reactions   Bactrim [Sulfamethoxazole-Trimethoprim]     Steven-johnson's syndrome    Sulfa Antibiotics     Steven-johnson's syndrome    Cozaar [Losartan] Other (See Comments)    Gave her asthma-like symptoms   Lisinopril Cough    Current Medications: Current Outpatient Medications  Medication Sig Dispense Refill   apixaban (ELIQUIS) 5 MG TABS tablet Take 5 mg by mouth 2 (two) times daily.     atorvastatin (LIPITOR) 20 MG tablet Take 20 mg by mouth daily.     carvedilol (COREG) 12.5 MG tablet Take 1 tablet (12.5 mg total) by mouth 2 (two) times daily. 180 tablet 1   estradiol (ESTRACE) 0.1 MG/GM vaginal cream Place 1 Applicatorful vaginally at bedtime.     Fluticasone Furoate-Vilanterol (BREO ELLIPTA IN) Inhale 1 puff into the lungs in the morning.     furosemide (LASIX) 20 MG tablet Take 40 mg by mouth daily.     glimepiride (AMARYL) 4 MG tablet Take 4 mg by mouth daily with breakfast. 1/2 tab     levothyroxine (SYNTHROID) 200 MCG tablet Take 200 mcg by mouth daily before breakfast.     Magnesium 500 MG TABS Take 500 mg by mouth 2 (two) times daily. 400-500 mg bid     Melatonin 5 MG CAPS Take 1 capsule by mouth at bedtime as needed (sleep).     metFORMIN (GLUCOPHAGE) 1000 MG tablet Take 1,000 mg by mouth 2 (two) times daily.     PARoxetine (PAXIL) 10 MG tablet Take 10 mg by mouth daily.     potassium chloride (KLOR-CON) 10 MEQ tablet Take 1 tablet (10 mEq total) by mouth daily. 90 tablet 0   ranolazine (RANEXA) 500 MG 12 hr tablet Take 1 tablet (500 mg total) by mouth 2 (two) times daily. 180 tablet 1   nystatin powder Apply 1 Application topically as needed (rash). (Patient not taking: Reported on 05/21/2023)     No current facility-administered medications for this visit.

## 2023-05-21 NOTE — Assessment & Plan Note (Addendum)
 Iron deficiency anemia, felt to most likely due to GI blood loss.  This may be due to decreased absorption after gastric bypass, but her surgery was done 20 years ago and she has never been anemic before and has not taken vitamins regularly. Also, she is on apixaban, so at increased risk for bleeding. She completed Feraheme at [facility] in February.  She continues to decline referral to Dr. Jennye Boroughs for consideration of EGD and colonoscopy.  Her hemoglobin has normalized and iron stores adequate with IV iron. She would like to follow up with Dr. Sudie Bailey. I am glad to see her back in the future as needed.

## 2023-05-21 NOTE — Assessment & Plan Note (Signed)
 B12 deficiency likely due to decreased absorption due to gastric bypass. She has responded to oral B12 supplement. I recommended she continue that.

## 2023-05-24 ENCOUNTER — Encounter: Payer: Self-pay | Admitting: Cardiology

## 2023-05-24 ENCOUNTER — Ambulatory Visit: Payer: PPO | Attending: Cardiology | Admitting: Cardiology

## 2023-05-24 VITALS — BP 120/40 | HR 62 | Ht 62.0 in | Wt 161.2 lb

## 2023-05-24 DIAGNOSIS — I10 Essential (primary) hypertension: Secondary | ICD-10-CM | POA: Diagnosis not present

## 2023-05-24 DIAGNOSIS — E782 Mixed hyperlipidemia: Secondary | ICD-10-CM | POA: Diagnosis not present

## 2023-05-24 DIAGNOSIS — Z794 Long term (current) use of insulin: Secondary | ICD-10-CM | POA: Diagnosis not present

## 2023-05-24 DIAGNOSIS — I255 Ischemic cardiomyopathy: Secondary | ICD-10-CM | POA: Diagnosis not present

## 2023-05-24 DIAGNOSIS — I2510 Atherosclerotic heart disease of native coronary artery without angina pectoris: Secondary | ICD-10-CM

## 2023-05-24 DIAGNOSIS — Z95810 Presence of automatic (implantable) cardiac defibrillator: Secondary | ICD-10-CM | POA: Diagnosis not present

## 2023-05-24 DIAGNOSIS — E119 Type 2 diabetes mellitus without complications: Secondary | ICD-10-CM | POA: Diagnosis not present

## 2023-05-24 NOTE — Addendum Note (Signed)
 Addended by: Baldo Ash D on: 05/24/2023 09:41 AM   Modules accepted: Orders

## 2023-05-24 NOTE — Progress Notes (Signed)
 Cardiology Office Note:    Date:  05/24/2023   ID:  Christina Perry, DOB [birth year], MRN [id number]  PCP:  Philemon Kingdom, MD  Cardiologist:  Gypsy Balsam, MD    Referring MD: Philemon Kingdom, MD   Chief Complaint  Patient presents with   Medication Management    History of Present Illness:    Christina Perry is a 73 y.o. female past medical history significant for coronary disease status post myocardial infarction 1996 with akinesis of apex, ejection fraction 40%, essential hypertension, dyslipidemia, ICD which is Medtronic device, LV thrombus.  She comes today to my office for follow-up.  She felt absolutely miserable recently she was found to have severe anemia that being corrected however no source of bleeding has been identified.  Her H&H is normal and she feels great asymptomatic back at work she can do what she wants to do with no difficulties.  Past Medical History:  Diagnosis Date   Anemia    Anxiety    Atrial fib/flutter, transient (HCC)    Cardiomyopathy (HCC)    CHF (congestive heart failure) (HCC)    Coronary artery disease involving native coronary artery of native heart without angina pectoris 11/02/2014   Overview:  Large anterior myocardial infarction and age of 24   Coronary artery stenosis    Defibrillator discharge 11/02/2014   Formatting of this note might be different from the original. 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   MI (myocardial infarction) [facility])    Thyroid disease    Type 2 diabetes mellitus without complication (HCC) 11/02/2014    Past Surgical History:  Procedure Laterality Date    left groin     enlarged lymph nodes removed/ cat scratch fever   BARIATRIC  SURGERY  2005   CAROTID STENT     CESAREAN SECTION  1979   CHOLECYSTECTOMY     CORONARY ANGIOPLASTY     ICD GENERATOR CHANGEOUT N/A 10/11/2017   Procedure: ICD GENERATOR CHANGEOUT;  Surgeon: Regan Lemming, MD;  Location: [facility] INVASIVE CV LAB;  Service: Cardiovascular;  Laterality: N/A;   ICD Placement     INCISION AND DRAINAGE     abcess right abdomen   TONSILLECTOMY     US BREAST LEFT ([facility] HISTORICAL)     biopsy/ non cancerous    Current Medications: Current Meds  Medication Sig   apixaban (ELIQUIS) 5 MG TABS tablet Take 5 mg by mouth 2 (two) times daily.   atorvastatin (LIPITOR) 20 MG tablet Take 20 mg by mouth daily.   carvedilol (COREG) 12.5 MG tablet Take 1 tablet (12.5 mg total) by mouth 2 (two) times daily.   estradiol (ESTRACE) 0.1 MG/GM vaginal cream Place 1 Applicatorful vaginally at bedtime.   Fluticasone Furoate-Vilanterol (BREO ELLIPTA IN) Inhale 1 puff into the lungs in the morning.   furosemide (LASIX) 20 MG tablet Take 40 mg by mouth daily.   glimepiride (AMARYL) 4 MG tablet Take 4 mg by mouth daily with breakfast. 1/2 tab   levothyroxine (SYNTHROID) 200 MCG tablet Take 200 mcg by mouth daily before breakfast.   Magnesium 500 MG TABS Take 500 mg by mouth 2 (two) times daily. 400-500 mg bid   Melatonin 5 MG CAPS  Take 1 capsule by mouth at bedtime as needed (sleep).   metFORMIN (GLUCOPHAGE) 1000 MG tablet Take 1,000 mg by mouth 2 (two) times daily.   nystatin powder Apply 1 Application topically as needed (rash). (Patient not taking: Reported on 05/21/2023)   PARoxetine (PAXIL) 10 MG tablet Take 10 mg by mouth daily.   potassium chloride (KLOR-CON) 10 MEQ tablet Take 1 tablet (10 mEq total) by mouth daily.   ranolazine (RANEXA) 500 MG 12 hr tablet Take 1 tablet (500 mg total) by mouth 2 (two) times daily.   [DISCONTINUED] Aloe Vera 25 MG CAPS Take 1 capsule by mouth as needed (apply to the skin).     Allergies:   Bactrim [sulfamethoxazole-trimethoprim], Sulfa  antibiotics, Cozaar [losartan], and Lisinopril   Social History   Socioeconomic History   Marital status: Married    Spouse name: Not on file   Number of children: 1   Years of education: Not on file   Highest education level: Master's degree (e.g., MA, MS, MEng, MEd, MSW, MBA)  Occupational History   Occupation: nurse  Tobacco Use   Smoking status: Never   Smokeless tobacco: Never  Vaping Use   Vaping status: Never Used  Substance and Sexual Activity   Alcohol use: Yes    Comment: occ   Drug use: No   Sexual activity: Not on file  Other Topics Concern   Not on file  Social History Narrative   Not on file   Social Drivers of Health   Financial Resource Strain: Not on file  Food Insecurity: No Food Insecurity (04/09/2023)   Hunger Vital Sign    Worried About Running Out of Food in the Last Year: Never true    Ran Out of Food in the Last Year: Never true  Transportation Needs: No Transportation Needs (04/09/2023)   PRAPARE - Administrator, Civil Service (Medical): No    Lack of Transportation (Non-Medical): No  Physical Activity: Not on file  Stress: Not on file  Social Connections: Socially Integrated (03/27/2023)   Social Connection and Isolation Panel [NHANES]    Frequency of Communication with Friends and Family: More than three times a week    Frequency of Social Gatherings with Friends and Family: Twice a week    Attends Religious Services: More than 4 times per year    Active Member of [employer] or Organizations: Yes    Attends Engineer, structural: More than 4 times per year    Marital Status: Married     Family History: The patient's family history includes CAD in her maternal grandmother and mother; COPD in her mother; Diabetes in her maternal grandmother and mother; Heart attack in her mother; Stroke in her maternal grandmother and mother. ROS:   Please see the history of present illness.    All 14 point review of systems negative except as  described per history of present illness  EKGs/Labs/Other Studies Reviewed:         Recent Labs: 03/15/2023: NT-Pro BNP 967 03/28/2023: TSH 0.119 05/21/2023: ALT 15; BUN 25; Creatinine 1.15; Hemoglobin 12.2; Platelet Count 236; Potassium 5.0; Sodium 141  Recent Lipid Panel No results found for: "CHOL", "TRIG", "HDL", "CHOLHDL", "VLDL", "LDLCALC", "LDLDIRECT"  Physical Exam:    VS:  BP (!) 120/40 (BP Location: Right Arm, Patient Position: Sitting)   Pulse 62   Ht 5\' 2"  (1.575 m)   Wt 161 lb 3.2 oz (73.1 kg)   SpO2 95%   BMI 29.48 kg/m  Wt Readings from Last 3 Encounters:  05/24/23 161 lb 3.2 oz (73.1 kg)  05/21/23 162 lb 4.8 oz (73.6 kg)  04/09/23 163 lb (73.9 kg)     GEN:  Well nourished, well developed in no acute distress HEENT: Normal NECK: No JVD; No carotid bruits LYMPHATICS: No lymphadenopathy CARDIAC: RRR, no murmurs, no rubs, no gallops RESPIRATORY:  Clear to auscultation without rales, wheezing or rhonchi  ABDOMEN: Soft, non-tender, non-distended MUSCULOSKELETAL:  No edema; No deformity  SKIN: Warm and dry LOWER EXTREMITIES: no swelling NEUROLOGIC:  Alert and oriented x 3 PSYCHIATRIC:  Normal affect   ASSESSMENT:    1. Coronary artery disease involving native coronary artery of native heart without angina pectoris   2. Ischemic cardiomyopathy   3. Primary hypertension   4. Type 2 diabetes mellitus without complication, with long-term current use of insulin (HCC)   5. Mixed hyperlipidemia   6. ICD (implantable cardioverter-defibrillator) in place    PLAN:    In order of problems listed above:  Coronary disease stable from that point.  Asymptomatic continue present management. Cardiomyopathy.  Stable continue present management. Dyslipidemia I do have her K PN which show me LDL 66 HDL 62 this is from November 2023, will redo the test. History of anemia followed by internal medicine team no source of bleeding identified but stable right now  continue. ICD present normal function   Medication Adjustments/Labs and Tests Ordered: Current medicines are reviewed at length with the patient today.  Concerns regarding medicines are outlined above.  No orders of the defined types were placed in this encounter.  Medication changes: No orders of the defined types were placed in this encounter.   Signed, Georgeanna Lea, MD, [facility] 05/24/2023 9:29 AM    [facility]

## 2023-05-24 NOTE — Patient Instructions (Addendum)
 Medication Instructions:   STOP: Ranolazine   Lab Work: Lipid, AST, ALT, BMP-today If you have labs (blood work) drawn today and your tests are completely normal, you will receive your results only by: MyChart Message (if you have MyChart) OR A paper copy in the mail If you have any lab test that is abnormal or we need to change your treatment, we will call you to review the results.   Testing/Procedures: None Ordered   Follow-Up: At [facility], you and your health needs are our priority.  As part of our continuing mission to provide you with exceptional heart care, we have created designated Provider Care Teams.  These Care Teams include your primary Cardiologist (physician) and Advanced Practice Providers (APPs -  Physician Assistants and Nurse Practitioners) who all work together to provide you with the care you need, when you need it.  We recommend signing up for the patient portal called "MyChart".  Sign up information is provided on this After Visit Summary.  MyChart is used to connect with patients for Virtual Visits (Telemedicine).  Patients are able to view lab/test results, encounter notes, upcoming appointments, etc.  Non-urgent messages can be sent to your provider as well.   To learn more about what you can do with MyChart, go to [email or web address].    Your next appointment:   6 month(s)  The format for your next appointment:   In Person  Provider:   Gypsy Balsam, MD    Other Instructions NA

## 2023-05-25 LAB — BASIC METABOLIC PANEL WITH GFR
BUN/Creatinine Ratio: 16 (ref 12–28)
BUN: 20 mg/dL (ref 8–27)
CO2: 23 mmol/L (ref 20–29)
Calcium: 10.1 mg/dL (ref 8.7–10.3)
Chloride: 100 mmol/L (ref 96–106)
Creatinine, Ser: 1.28 mg/dL — ABNORMAL HIGH (ref 0.57–1.00)
Glucose: 113 mg/dL — ABNORMAL HIGH (ref 70–99)
Potassium: 5.2 mmol/L (ref 3.5–5.2)
Sodium: 141 mmol/L (ref 134–144)
eGFR: 44 mL/min/{1.73_m2} — ABNORMAL LOW (ref >59)

## 2023-05-25 LAB — LIPID PANEL
Chol/HDL Ratio: 2.1 ratio (ref 0.0–4.4)
Cholesterol, Total: 127 mg/dL (ref 100–199)
HDL: 61 mg/dL (ref >39)
LDL Chol Calc (NIH): 47 mg/dL (ref 0–99)
Triglycerides: 101 mg/dL (ref 0–149)
VLDL Cholesterol Cal: 19 mg/dL (ref 5–40)

## 2023-05-25 LAB — ALT: ALT: 15 IU/L (ref 0–32)

## 2023-05-25 LAB — AST: AST: 17 IU/L (ref 0–40)

## 2023-05-25 NOTE — Progress Notes (Signed)
 Remote ICD transmission.

## 2023-05-25 NOTE — Addendum Note (Signed)
 Addended by: Geralyn Flash D on: 05/25/2023 01:15 PM   Modules accepted: Orders

## 2023-06-03 ENCOUNTER — Telehealth: Payer: Self-pay

## 2023-06-03 NOTE — Telephone Encounter (Signed)
 Lab Results reviewed with pt as per Dr. Vanetta Shawl note.  Pt verbalized understanding and had no additional questions. Routed to PCP

## 2023-06-07 DIAGNOSIS — H6122 Impacted cerumen, left ear: Secondary | ICD-10-CM | POA: Diagnosis not present

## 2023-06-24 DIAGNOSIS — K6289 Other specified diseases of anus and rectum: Secondary | ICD-10-CM | POA: Diagnosis not present

## 2023-07-15 DIAGNOSIS — Z1231 Encounter for screening mammogram for malignant neoplasm of breast: Secondary | ICD-10-CM | POA: Diagnosis not present

## 2023-07-20 ENCOUNTER — Ambulatory Visit (INDEPENDENT_AMBULATORY_CARE_PROVIDER_SITE_OTHER): Payer: HMO

## 2023-07-20 DIAGNOSIS — I255 Ischemic cardiomyopathy: Secondary | ICD-10-CM

## 2023-07-20 LAB — CUP PACEART REMOTE DEVICE CHECK
Battery Remaining Longevity: 23 mo
Battery Voltage: 2.92 V
Brady Statistic AP VP Percent: 0 %
Brady Statistic AP VS Percent: 25.11 %
Brady Statistic AS VP Percent: 0 %
Brady Statistic AS VS Percent: 74.89 %
Brady Statistic RA Percent Paced: 24.76 %
Brady Statistic RV Percent Paced: 0 %
Date Time Interrogation Session: 20250603043724
HighPow Impedance: 40 Ohm
HighPow Impedance: 48 Ohm
Implantable Lead Connection Status: 753985
Implantable Lead Connection Status: 753985
Implantable Lead Implant Date: 20111212
Implantable Lead Implant Date: 20111212
Implantable Lead Location: 753859
Implantable Lead Location: 753860
Implantable Lead Model: 5076
Implantable Lead Model: 6947
Implantable Pulse Generator Implant Date: 20190826
Lead Channel Impedance Value: 304 Ohm
Lead Channel Impedance Value: 399 Ohm
Lead Channel Impedance Value: 399 Ohm
Lead Channel Pacing Threshold Amplitude: 1.5 V
Lead Channel Pacing Threshold Pulse Width: 0.4 ms
Lead Channel Sensing Intrinsic Amplitude: 0.875 mV
Lead Channel Sensing Intrinsic Amplitude: 0.875 mV
Lead Channel Sensing Intrinsic Amplitude: 5.5 mV
Lead Channel Sensing Intrinsic Amplitude: 5.5 mV
Lead Channel Setting Pacing Amplitude: 2.5 V
Lead Channel Setting Pacing Amplitude: 5 V
Lead Channel Setting Pacing Pulse Width: 1 ms
Lead Channel Setting Sensing Sensitivity: 0.3 mV
Zone Setting Status: 755011
Zone Setting Status: 755011

## 2023-07-30 ENCOUNTER — Ambulatory Visit: Payer: Self-pay | Admitting: Cardiology

## 2023-08-03 DIAGNOSIS — E785 Hyperlipidemia, unspecified: Secondary | ICD-10-CM | POA: Diagnosis not present

## 2023-08-03 DIAGNOSIS — Z6829 Body mass index (BMI) 29.0-29.9, adult: Secondary | ICD-10-CM | POA: Diagnosis not present

## 2023-08-03 DIAGNOSIS — E039 Hypothyroidism, unspecified: Secondary | ICD-10-CM | POA: Diagnosis not present

## 2023-08-03 DIAGNOSIS — E1169 Type 2 diabetes mellitus with other specified complication: Secondary | ICD-10-CM | POA: Diagnosis not present

## 2023-08-03 DIAGNOSIS — I10 Essential (primary) hypertension: Secondary | ICD-10-CM | POA: Diagnosis not present

## 2023-08-03 DIAGNOSIS — D509 Iron deficiency anemia, unspecified: Secondary | ICD-10-CM | POA: Diagnosis not present

## 2023-08-03 DIAGNOSIS — I480 Paroxysmal atrial fibrillation: Secondary | ICD-10-CM | POA: Diagnosis not present

## 2023-08-03 DIAGNOSIS — J449 Chronic obstructive pulmonary disease, unspecified: Secondary | ICD-10-CM | POA: Diagnosis not present

## 2023-08-03 DIAGNOSIS — Z79899 Other long term (current) drug therapy: Secondary | ICD-10-CM | POA: Diagnosis not present

## 2023-08-03 DIAGNOSIS — G250 Essential tremor: Secondary | ICD-10-CM | POA: Diagnosis not present

## 2023-08-03 DIAGNOSIS — D6869 Other thrombophilia: Secondary | ICD-10-CM | POA: Diagnosis not present

## 2023-09-09 DIAGNOSIS — D225 Melanocytic nevi of trunk: Secondary | ICD-10-CM | POA: Diagnosis not present

## 2023-09-09 DIAGNOSIS — L821 Other seborrheic keratosis: Secondary | ICD-10-CM | POA: Diagnosis not present

## 2023-09-09 DIAGNOSIS — L814 Other melanin hyperpigmentation: Secondary | ICD-10-CM | POA: Diagnosis not present

## 2023-09-09 DIAGNOSIS — D2239 Melanocytic nevi of other parts of face: Secondary | ICD-10-CM | POA: Diagnosis not present

## 2023-09-14 NOTE — Progress Notes (Signed)
 Remote ICD transmission.

## 2023-10-19 ENCOUNTER — Ambulatory Visit (INDEPENDENT_AMBULATORY_CARE_PROVIDER_SITE_OTHER): Payer: HMO

## 2023-10-19 DIAGNOSIS — I255 Ischemic cardiomyopathy: Secondary | ICD-10-CM

## 2023-10-21 ENCOUNTER — Ambulatory Visit: Payer: Self-pay | Admitting: Cardiology

## 2023-10-21 LAB — CUP PACEART REMOTE DEVICE CHECK
Battery Remaining Longevity: 22 mo
Battery Voltage: 2.92 V
Brady Statistic AP VP Percent: 0 %
Brady Statistic AP VS Percent: 29.87 %
Brady Statistic AS VP Percent: 0 %
Brady Statistic AS VS Percent: 70.13 %
Brady Statistic RA Percent Paced: 29.19 %
Brady Statistic RV Percent Paced: 0 %
Date Time Interrogation Session: 20250902022606
HighPow Impedance: 38 Ohm
HighPow Impedance: 48 Ohm
Implantable Lead Connection Status: 753985
Implantable Lead Connection Status: 753985
Implantable Lead Implant Date: 20111212
Implantable Lead Implant Date: 20111212
Implantable Lead Location: 753859
Implantable Lead Location: 753860
Implantable Lead Model: 5076
Implantable Lead Model: 6947
Implantable Pulse Generator Implant Date: 20190826
Lead Channel Impedance Value: 285 Ohm
Lead Channel Impedance Value: 399 Ohm
Lead Channel Impedance Value: 399 Ohm
Lead Channel Pacing Threshold Amplitude: 1.5 V
Lead Channel Pacing Threshold Pulse Width: 0.4 ms
Lead Channel Sensing Intrinsic Amplitude: 0.75 mV
Lead Channel Sensing Intrinsic Amplitude: 0.75 mV
Lead Channel Sensing Intrinsic Amplitude: 4.5 mV
Lead Channel Sensing Intrinsic Amplitude: 4.5 mV
Lead Channel Setting Pacing Amplitude: 2.5 V
Lead Channel Setting Pacing Amplitude: 5 V
Lead Channel Setting Pacing Pulse Width: 1 ms
Lead Channel Setting Sensing Sensitivity: 0.3 mV
Zone Setting Status: 755011
Zone Setting Status: 755011

## 2023-10-26 ENCOUNTER — Telehealth: Payer: Self-pay | Admitting: Cardiology

## 2023-10-26 NOTE — Progress Notes (Signed)
Remote ICD Transmission.

## 2023-10-26 NOTE — Telephone Encounter (Signed)
  1. Has your device fired?  No  2. Is you device beeping?   Patient noted the device beeped twice today at different times  3. Are you experiencing draining or swelling at device site?   No  4. Are you calling to see if we received your device transmission?  No  5. Have you passed out?  No   Please route to Device Clinic Pool

## 2023-10-26 NOTE — Telephone Encounter (Signed)
 Attempted to assist patient with sending. Unable to send. Patient provided [employer] number to contact to assist with sending a transmission.

## 2023-10-26 NOTE — Telephone Encounter (Signed)
 Attempted to assist patient with sending a remote transmission. Monitor is charging per patient on the screen. Will retry in 1 hour and call the device clinic back.   Patient is asymptomatic and felt fine today.

## 2023-10-27 NOTE — Telephone Encounter (Signed)
 Remote transmission received. No alerts triggered on device. Patient called and advised of findings and was appreciative of call back.

## 2023-11-10 DIAGNOSIS — D509 Iron deficiency anemia, unspecified: Secondary | ICD-10-CM | POA: Diagnosis not present

## 2023-11-10 DIAGNOSIS — Z79899 Other long term (current) drug therapy: Secondary | ICD-10-CM | POA: Diagnosis not present

## 2023-11-10 DIAGNOSIS — I10 Essential (primary) hypertension: Secondary | ICD-10-CM | POA: Diagnosis not present

## 2023-11-10 DIAGNOSIS — E785 Hyperlipidemia, unspecified: Secondary | ICD-10-CM | POA: Diagnosis not present

## 2023-11-10 DIAGNOSIS — R0602 Shortness of breath: Secondary | ICD-10-CM | POA: Diagnosis not present

## 2023-11-10 DIAGNOSIS — R5383 Other fatigue: Secondary | ICD-10-CM | POA: Diagnosis not present

## 2023-11-10 DIAGNOSIS — E039 Hypothyroidism, unspecified: Secondary | ICD-10-CM | POA: Diagnosis not present

## 2023-11-10 LAB — LAB REPORT - SCANNED
A1c: 6.7
EGFR: 53

## 2023-11-15 ENCOUNTER — Telehealth: Payer: Self-pay | Admitting: Cardiology

## 2023-11-15 NOTE — Telephone Encounter (Signed)
 Called the patient and she stated that she had a Pro BNP done at [place] and the value was 891. As a result of her Pro BNP value she increased her Lasix  to 40 mg daily. Please advise?

## 2023-11-15 NOTE — Telephone Encounter (Signed)
 Pt would like a c/b in regards her results for her BMP: 891 and she increased her Lasix  to 40 MG daily.

## 2023-11-16 ENCOUNTER — Other Ambulatory Visit: Payer: Self-pay

## 2023-11-16 ENCOUNTER — Inpatient Hospital Stay

## 2023-11-16 ENCOUNTER — Other Ambulatory Visit: Payer: Self-pay | Admitting: Hematology and Oncology

## 2023-11-16 ENCOUNTER — Inpatient Hospital Stay: Attending: Hematology and Oncology | Admitting: Hematology and Oncology

## 2023-11-16 VITALS — BP 128/48 | HR 75 | Temp 98.2°F | Resp 16 | Ht 62.0 in | Wt 163.1 lb

## 2023-11-16 DIAGNOSIS — D519 Vitamin B12 deficiency anemia, unspecified: Secondary | ICD-10-CM | POA: Insufficient documentation

## 2023-11-16 DIAGNOSIS — D500 Iron deficiency anemia secondary to blood loss (chronic): Secondary | ICD-10-CM

## 2023-11-16 DIAGNOSIS — D509 Iron deficiency anemia, unspecified: Secondary | ICD-10-CM | POA: Insufficient documentation

## 2023-11-16 DIAGNOSIS — Z9884 Bariatric surgery status: Secondary | ICD-10-CM | POA: Insufficient documentation

## 2023-11-17 ENCOUNTER — Other Ambulatory Visit: Payer: Self-pay | Admitting: Hematology and Oncology

## 2023-11-17 ENCOUNTER — Other Ambulatory Visit: Payer: Self-pay

## 2023-11-17 NOTE — Telephone Encounter (Signed)
 Left message for the patient to call back.

## 2023-11-17 NOTE — Telephone Encounter (Signed)
 Called the patient and informed her that Dr. Bernie agreed with her increasing her lasix  to 40 mg daily. He stated that is what he would have done. Patient verbalized understanding and had no further questions at this time.

## 2023-11-18 ENCOUNTER — Telehealth: Payer: Self-pay

## 2023-11-18 ENCOUNTER — Other Ambulatory Visit: Payer: Self-pay | Admitting: Hematology and Oncology

## 2023-11-18 NOTE — Telephone Encounter (Signed)
 Pt has called, frustrated that the orders for IV iron haven't been sent to [facility].   [facility] fax # [phone]

## 2023-11-19 DIAGNOSIS — D509 Iron deficiency anemia, unspecified: Secondary | ICD-10-CM | POA: Diagnosis not present

## 2023-11-22 DIAGNOSIS — N390 Urinary tract infection, site not specified: Secondary | ICD-10-CM | POA: Diagnosis not present

## 2023-11-30 DIAGNOSIS — D509 Iron deficiency anemia, unspecified: Secondary | ICD-10-CM | POA: Diagnosis not present

## 2023-11-30 DIAGNOSIS — Z87440 Personal history of urinary (tract) infections: Secondary | ICD-10-CM | POA: Diagnosis not present

## 2023-11-30 DIAGNOSIS — E1169 Type 2 diabetes mellitus with other specified complication: Secondary | ICD-10-CM | POA: Diagnosis not present

## 2023-11-30 DIAGNOSIS — J449 Chronic obstructive pulmonary disease, unspecified: Secondary | ICD-10-CM | POA: Diagnosis not present

## 2023-11-30 DIAGNOSIS — Z0000 Encounter for general adult medical examination without abnormal findings: Secondary | ICD-10-CM | POA: Diagnosis not present

## 2023-11-30 DIAGNOSIS — I10 Essential (primary) hypertension: Secondary | ICD-10-CM | POA: Diagnosis not present

## 2023-11-30 DIAGNOSIS — Z7189 Other specified counseling: Secondary | ICD-10-CM | POA: Diagnosis not present

## 2023-11-30 DIAGNOSIS — J45991 Cough variant asthma: Secondary | ICD-10-CM | POA: Diagnosis not present

## 2023-11-30 DIAGNOSIS — Z6830 Body mass index (BMI) 30.0-30.9, adult: Secondary | ICD-10-CM | POA: Diagnosis not present

## 2023-11-30 DIAGNOSIS — G4700 Insomnia, unspecified: Secondary | ICD-10-CM | POA: Diagnosis not present

## 2023-11-30 DIAGNOSIS — Z1331 Encounter for screening for depression: Secondary | ICD-10-CM | POA: Diagnosis not present

## 2023-11-30 DIAGNOSIS — I480 Paroxysmal atrial fibrillation: Secondary | ICD-10-CM | POA: Diagnosis not present

## 2023-12-13 DIAGNOSIS — Z79899 Other long term (current) drug therapy: Secondary | ICD-10-CM | POA: Diagnosis not present

## 2023-12-13 DIAGNOSIS — Z01818 Encounter for other preprocedural examination: Secondary | ICD-10-CM | POA: Diagnosis not present

## 2023-12-13 DIAGNOSIS — D649 Anemia, unspecified: Secondary | ICD-10-CM | POA: Diagnosis not present

## 2023-12-13 DIAGNOSIS — Z87440 Personal history of urinary (tract) infections: Secondary | ICD-10-CM | POA: Diagnosis not present

## 2023-12-14 ENCOUNTER — Telehealth: Payer: Self-pay | Admitting: *Deleted

## 2023-12-14 NOTE — Telephone Encounter (Signed)
   Pre-operative Risk Assessment    Patient Name: Christina Perry  DOB: 1950/12/16 MRN: [id number]      Request for Surgical Clearance    Procedure:  Colonoscopy & EGD  Date of Surgery:  Clearance 01/25/24                                 Surgeon:  Dr. Evalene ALF Misenheimer Surgeon's Group or Practice Name:  [facility] Phone number:  [phone] Fax number:  [phone]   Type of Clearance Requested:   - Pharmacy:  Hold Apixaban  (Eliquis ) Stop on Dec. 7th   Type of Anesthesia:  Not Indicated   Additional requests/questions:    Bonney Arloa Donovan Levorn   12/14/2023, 1:12 PM

## 2023-12-15 NOTE — Telephone Encounter (Signed)
 Pharmacy please advise on holding Eliquis  prior to colonoscopy & EDG  scheduled for 01/25/2024. Last labs 05/21/2023. Thank you.

## 2023-12-20 NOTE — Progress Notes (Unsigned)
 [facility]  [address] [place],  [place]  [address] [phone]  Clinic Day:  12/22/2023  Referring physician: Jefferey Fitch, MD  ASSESSMENT & PLAN:   Assessment & Plan:   Iron deficiency anemia due to chronic blood loss Iron deficiency anemia, felt to most likely due to GI blood loss.  This may be due to decreased absorption after gastric bypass, but her surgery was done 20 years ago and she has never been anemic before and has not taken vitamins regularly. Also, she is on apixaban , so at increased risk for bleeding. She completed Feraheme at [facility] in February.  She continues to decline referral to Dr. Larene for consideration of EGD and colonoscopy.  Her hemoglobin has normalized and iron stores adequate with IV iron. She would like to follow up with Dr. Jefferey. I am glad to see her back in the future as needed.  She presents back to clinic with hemoglobin 8.1 and worsening symptoms of anemia. We will plan for repeat outpatient iron. She prefers to go to [facility] outpatient for this infusion as she works there. I will send orders over for IV Feraheme.    B12 deficiency anemia B12 deficiency likely due to decreased absorption due to gastric bypass. She has responded to oral B12 supplement. I recommended she continue that.  The patient understands the plans discussed today and is in agreement with them.  She knows to contact our office if she develops concerns prior to her next appointment.   I provided 30 minutes of face-to-face time during this encounter and > 50% was spent counseling as documented under my assessment and plan.    Christina DELENA Bach, NP  [facility] OF [facility] [address] [place] [address] Dept: [phone] Dept Fax: [phone]   No orders of the defined types were placed in this encounter.     CHIEF COMPLAINT:  CC: Iron and B12 deficiency  anemia  Current Treatment:  Oral B12  HISTORY OF PRESENT ILLNESS:  Christina Perry is a 73 year old with iron deficiency anemia. The patient saw her cardiologist on February 7.  CBC revealed a hemoglobin of 7.2 with an MCV of 62.5, so she was referred to [facility] ER.  Her hemoglobin was 6.4 there, so she was transferred to [facility].  Her hemoglobin dropped to 5.9 with an MCV of 62.  She received 2 units of packed red blood cells.  Evaluation revealed iron deficiency with a serum iron of 9 micrograms/dL, TIBC 548 micrograms/dL, iron saturation 2% and ferritin 4 ng/mL.  She also had B12 deficiency with a B12 of 171 pg/mL.  Dr. Jefferey arranged for IV Feraheme as an outpatient in February.  The patient has declined referral to Dr. Larene for consideration of EGD and colonoscopy.  INTERVAL HISTORY:  Christina Perry is here today for repeat clinical assessment.  She has worsening anemia symptoms with a hemoglobin of 8.1. She states she tolerated IV iron well in the past. She denies fever, chills, nausea or vomiting.  REVIEW OF SYSTEMS:  Review of Systems  Constitutional:  Negative for appetite change, chills, fatigue, fever and unexpected weight change.  HENT:   Negative for lump/mass, mouth sores, nosebleeds and sore throat.   Respiratory:  Negative for cough, hemoptysis and shortness of breath.   Cardiovascular:  Negative for chest pain and leg swelling.  Gastrointestinal:  Negative for abdominal pain, blood in stool, constipation,  diarrhea, nausea and vomiting.  Endocrine: Negative for hot flashes.  Genitourinary:  Negative for difficulty urinating, dysuria, frequency, hematuria and vaginal bleeding.   Musculoskeletal:  Negative for arthralgias, back pain, gait problem and myalgias.  Skin:  Negative for rash.  Neurological:  Negative for dizziness, extremity weakness, gait problem, headaches, light-headedness and numbness.  Hematological:  Negative for adenopathy. Does not bruise/bleed easily.   Psychiatric/Behavioral:  Negative for depression and sleep disturbance. The patient is not nervous/anxious.      VITALS:  Blood pressure (!) 128/48, pulse 75, temperature 98.2 F (36.8 C), temperature source Oral, resp. rate 16, height 5' 2 (1.575 m), weight 163 lb 1.6 oz (74 kg), SpO2 99%.  Wt Readings from Last 3 Encounters:  11/16/23 163 lb 1.6 oz (74 kg)  05/24/23 161 lb 3.2 oz (73.1 kg)  05/21/23 162 lb 4.8 oz (73.6 kg)    Body mass index is 29.83 kg/m.  Performance status (ECOG): 0 - Asymptomatic  PHYSICAL EXAM:  Physical Exam Vitals and nursing note reviewed.  Constitutional:      General: She is not in acute distress.    Appearance: Normal appearance. She is not ill-appearing.  HENT:     Head: Normocephalic and atraumatic.     Mouth/Throat:     Mouth: Mucous membranes are moist.     Pharynx: Oropharynx is clear. No oropharyngeal exudate or posterior oropharyngeal erythema.  Eyes:     General: No scleral icterus.    Extraocular Movements: Extraocular movements intact.     Conjunctiva/sclera: Conjunctivae normal.     Pupils: Pupils are equal, round, and reactive to light.  Cardiovascular:     Rate and Rhythm: Normal rate and regular rhythm.     Heart sounds: Normal heart sounds. No murmur heard.    No friction rub. No gallop.  Pulmonary:     Effort: Pulmonary effort is normal.     Breath sounds: Normal breath sounds. No wheezing, rhonchi or rales.  Abdominal:     General: There is no distension.     Palpations: Abdomen is soft. There is no hepatomegaly, splenomegaly or mass.     Tenderness: There is no abdominal tenderness.  Musculoskeletal:        General: Normal range of motion.     Cervical back: Normal range of motion and neck supple. No tenderness.     Right lower leg: No edema.     Left lower leg: No edema.  Lymphadenopathy:     Cervical: No cervical adenopathy.     Upper Body:     Right upper body: No supraclavicular or axillary adenopathy.     Left  upper body: No supraclavicular or axillary adenopathy.     Lower Body: No right inguinal adenopathy. No left inguinal adenopathy.  Skin:    General: Skin is warm and dry.     Coloration: Skin is not jaundiced.     Findings: No rash.  Neurological:     Mental Status: She is alert and oriented to person, place, and time.     Cranial Nerves: No cranial nerve deficit.  Psychiatric:        Mood and Affect: Mood normal.        Behavior: Behavior normal.        Thought Content: Thought content normal.     LABS:      Latest Ref Rng & Units 05/21/2023    9:50 AM 04/09/2023    9:01 AM 03/28/2023    6:46 AM  CBC  WBC 4.0 - 10.5 K/uL 8.7  9.5  10.2   Hemoglobin 12.0 - 15.0 g/dL 87.7  9.5  9.3   Hematocrit 36.0 - 46.0 % 37.5  30.9  29.3   Platelets 150 - 400 K/uL 236  317  315       Latest Ref Rng & Units 05/24/2023    9:50 AM 05/21/2023    9:50 AM 03/28/2023    6:46 AM  CMP  Glucose 70 - 99 mg/dL 886  887  870   BUN 8 - 27 mg/dL 20  25  21    Creatinine 0.57 - 1.00 mg/dL 8.71  8.84  8.42   Sodium 134 - 144 mmol/L 141  141  138   Potassium 3.5 - 5.2 mmol/L 5.2  5.0  5.0   Chloride 96 - 106 mmol/L 100  104  103   CO2 20 - 29 mmol/L 23  25  22    Calcium  8.7 - 10.3 mg/dL 89.8  9.9  9.0   Total Protein 6.5 - 8.1 g/dL  6.6    Total Bilirubin 0.0 - 1.2 mg/dL  0.2    Alkaline Phos 38 - 126 U/L  75    AST 0 - 40 IU/L 17  19    ALT 0 - 32 IU/L 15  15      Lab Results  Component Value Date   TOTALPROTELP 5.7 (L) 03/27/2023   ALBUMINELP 3.2 03/27/2023   A1GS 0.2 03/27/2023   A2GS 0.7 03/27/2023   BETS 0.9 03/27/2023   GAMS 0.7 03/27/2023   MSPIKE Not Observed 03/27/2023   SPEI Comment 03/27/2023   Lab Results  Component Value Date   TIBC 347 05/21/2023   TIBC 451 (H) 03/27/2023   FERRITIN 75 05/21/2023   FERRITIN 4 (L) 03/27/2023   IRONPCTSAT 21 05/21/2023   IRONPCTSAT 2 (L) 03/27/2023     STUDIES:  No results found.    HISTORY:   Past Medical History:  Diagnosis Date    Anemia    Anxiety    Atrial fib/flutter, transient (HCC)    Cardiomyopathy (HCC)    CHF (congestive heart failure) (HCC)    Coronary artery disease involving native coronary artery of native heart without angina pectoris 11/02/2014   Overview:  Large anterior myocardial infarction and age of 71   Coronary artery stenosis    Defibrillator discharge 11/02/2014   Formatting of this note might be different from the original. 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   MI (myocardial infarction) (HCC)    Thyroid  disease    Type 2 diabetes mellitus without complication 11/02/2014    Past Surgical History:  Procedure Laterality Date    left groin     enlarged lymph nodes removed/ cat scratch fever   BARIATRIC SURGERY  2005   CAROTID STENT     CESAREAN SECTION  1979   CHOLECYSTECTOMY     CORONARY ANGIOPLASTY     ICD GENERATOR CHANGEOUT N/A 10/11/2017   Procedure: ICD GENERATOR CHANGEOUT;  Surgeon: Inocencio Soyla Lunger, MD;  Location: [facility] INVASIVE CV LAB;  Service: Cardiovascular;  Laterality: N/A;   ICD Placement     INCISION AND DRAINAGE     abcess  right abdomen   TONSILLECTOMY     US  BREAST LEFT ([facility] HISTORICAL)     biopsy/ non cancerous    Family History  Problem Relation Age of Onset   COPD Mother    Diabetes Mother    CAD Mother    Stroke Mother    Heart attack Mother    CAD Maternal Grandmother    Diabetes Maternal Grandmother    Stroke Maternal Grandmother     Social History:  reports that she has never smoked. She has never used smokeless tobacco. She reports current alcohol  use. She reports that she does not use drugs.The patient is accompanied by her husband today.  Allergies:  Allergies   Allergen Reactions   Bactrim [Sulfamethoxazole-Trimethoprim]     Steven-johnson's syndrome    Sulfa Antibiotics     Steven-johnson's syndrome    Cozaar  [Losartan ] Other (See Comments)    Gave her asthma-like symptoms   Lisinopril Cough    Current Medications: Current Outpatient Medications  Medication Sig Dispense Refill   apixaban  (ELIQUIS ) 5 MG TABS tablet Take 5 mg by mouth 2 (two) times daily.     atorvastatin  (LIPITOR) 20 MG tablet Take 20 mg by mouth daily.     carvedilol  (COREG ) 12.5 MG tablet Take 1 tablet (12.5 mg total) by mouth 2 (two) times daily. 180 tablet 1   estradiol (ESTRACE) 0.1 MG/GM vaginal cream Place 1 Applicatorful vaginally at bedtime.     Fluticasone  Furoate-Vilanterol (BREO ELLIPTA  IN) Inhale 1 puff into the lungs in the morning.     furosemide  (LASIX ) 20 MG tablet Take 40 mg by mouth daily.     glimepiride  (AMARYL ) 4 MG tablet Take 4 mg by mouth daily with breakfast. 1/2 tab     levothyroxine  (SYNTHROID ) 200 MCG tablet Take 200 mcg by mouth daily before breakfast.     Magnesium  500 MG TABS Take 500 mg by mouth 2 (two) times daily. 400-500 mg bid     Melatonin 5 MG CAPS Take 1 capsule by mouth at bedtime as needed (sleep).     metFORMIN  (GLUCOPHAGE ) 1000 MG tablet Take 1,000 mg by mouth 2 (two) times daily.     PARoxetine  (PAXIL ) 10 MG tablet Take 10 mg by mouth daily.     potassium chloride  (KLOR-CON ) 10 MEQ tablet Take 1 tablet (10 mEq total) by mouth daily. 90 tablet 0   nystatin powder Apply 1 Application topically as needed (rash). (Patient not taking: Reported on 05/21/2023)     No current facility-administered medications for this visit.

## 2023-12-21 NOTE — Telephone Encounter (Signed)
   Patient Name: Christina Perry  DOB: 1950/12/05 MRN: [id number]  Primary Cardiologist: Lamar Fitch, MD  Chart reviewed as part of pre-operative protocol coverage. Ok to hold Eliquis  for procedure for 2 days prior to procedure and should resume as soon as hemodynamically stable as the direction of the surgeon.    I will route this recommendation to the requesting party via Epic fax function and remove from pre-op pool.  Please call with questions.  Christina JAYSON Hoover, NP 12/21/2023, 9:59 AM

## 2023-12-21 NOTE — Telephone Encounter (Signed)
 Patient with diagnosis of LV thrombus on Eliquis  for anticoagulation.    Procedure: Colonoscopy & EGD  Date of procedure: 01/25/24    CrCl 45 ml/min Platelet count 236K   Per office protocol, patient can hold Eliquis  for 2 days prior to procedure.    **This guidance is not considered finalized until pre-operative APP has relayed final recommendations.**

## 2023-12-28 ENCOUNTER — Encounter: Payer: Self-pay | Admitting: *Deleted

## 2023-12-29 ENCOUNTER — Encounter: Payer: Self-pay | Admitting: Cardiology

## 2023-12-29 ENCOUNTER — Ambulatory Visit: Attending: Cardiology | Admitting: Cardiology

## 2023-12-29 VITALS — BP 110/60 | HR 64 | Ht 62.0 in | Wt 163.0 lb

## 2023-12-29 DIAGNOSIS — I10 Essential (primary) hypertension: Secondary | ICD-10-CM | POA: Diagnosis not present

## 2023-12-29 DIAGNOSIS — R0609 Other forms of dyspnea: Secondary | ICD-10-CM

## 2023-12-29 DIAGNOSIS — I2510 Atherosclerotic heart disease of native coronary artery without angina pectoris: Secondary | ICD-10-CM

## 2023-12-29 DIAGNOSIS — Z95810 Presence of automatic (implantable) cardiac defibrillator: Secondary | ICD-10-CM | POA: Diagnosis not present

## 2023-12-29 DIAGNOSIS — I255 Ischemic cardiomyopathy: Secondary | ICD-10-CM

## 2023-12-29 DIAGNOSIS — E119 Type 2 diabetes mellitus without complications: Secondary | ICD-10-CM

## 2023-12-29 NOTE — Addendum Note (Signed)
 Addended by: ARLOA PLANAS D on: 12/29/2023 02:46 PM   Modules accepted: Orders

## 2023-12-29 NOTE — Patient Instructions (Signed)

## 2023-12-29 NOTE — Progress Notes (Signed)
 Cardiology Office Note:    Date:  12/29/2023   ID:  Christina Perry, DOB 1950-03-25, MRN [id number]  PCP:  Jefferey Fitch, MD  Cardiologist:  Lamar Fitch, MD    Referring MD: Jefferey Fitch, MD   Chief Complaint  Patient presents with   Follow-up  Doing fine  History of Present Illness:    Christina Perry is a 73 y.o. female past medical history significant for coronary artery disease, status post myocardial infarction 1996 with akinesis of the apex, ejection fraction 40%, essential hypertension, dyslipidemia, ICD which is Medtronic device, LV thrombus comes today to months for follow-up overall doing well denies of any chest pain tightness squeezing pressure burning chest.  Few months ago again she became anemic and required iron transfusion she is scheduled to have colonoscopy [place] but she was told that the reason for her anemia is probably iron deficiency secondary to fact she did have gastric bypass surgery done before.  Denies have any discharge from defibrillator no dizziness no passing out  Past Medical History:  Diagnosis Date   Anemia    Anxiety    Atrial fib/flutter, transient (HCC)    Cardiomyopathy (HCC)    CHF (congestive heart failure) (HCC)    Coronary artery disease involving native coronary artery of native heart without angina pectoris 11/02/2014   Overview:  Large anterior myocardial infarction and age of 46   Coronary artery stenosis    Defibrillator discharge 11/02/2014   Formatting of this note might be different from the original. 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 INVASIVE CV LAB;  Service: Cardiovascular;  Laterality: N/A;   ICD Placement     INCISION AND DRAINAGE     abcess right abdomen   TONSILLECTOMY     US  BREAST LEFT ([facility] HISTORICAL)     biopsy/ non cancerous    Current Medications: Current Meds  Medication Sig   apixaban  (ELIQUIS ) 5 MG TABS tablet Take 5 mg by mouth 2 (two) times daily.   atorvastatin  (LIPITOR) 20 MG tablet Take 20 mg by mouth daily.   carvedilol  (COREG ) 12.5 MG tablet Take 1 tablet (12.5 mg total) by mouth 2 (two) times daily.   estradiol (ESTRACE) 0.1 MG/GM vaginal cream Place 1 Applicatorful vaginally at bedtime.   Fluticasone  Furoate-Vilanterol (BREO ELLIPTA  IN) Inhale 1 puff into the lungs in the morning.   furosemide  (LASIX ) 20 MG tablet Take 40 mg by mouth daily.   glimepiride  (AMARYL ) 4 MG tablet Take 4 mg by mouth daily with breakfast. 1/2 tab   levothyroxine  (SYNTHROID ) 200 MCG tablet Take 200 mcg by mouth daily before breakfast.   Magnesium  500 MG  TABS Take 500 mg by mouth 2 (two) times daily. 400-500 mg bid   Melatonin 5 MG CAPS Take 1 capsule by mouth at bedtime as needed (sleep).   metFORMIN  (GLUCOPHAGE ) 1000 MG tablet Take 1,000 mg by mouth 2 (two) times daily.   PARoxetine  (PAXIL ) 10 MG tablet Take 10 mg by mouth daily.     Allergies:   Bactrim [sulfamethoxazole-trimethoprim], Sulfa antibiotics, Cozaar  [losartan ], and Lisinopril   Social History   Socioeconomic History   Marital status: Married    Spouse name: Not on file   Number of children: 1   Years of education: Not on file   Highest education level: Master's degree  (e.g., MA, MS, MEng, MEd, MSW, MBA)  Occupational History   Occupation: nurse  Tobacco Use   Smoking status: Never   Smokeless tobacco: Never  Vaping Use   Vaping status: Never Used  Substance and Sexual Activity   Alcohol  use: Yes    Comment: occ   Drug use: No   Sexual activity: Not on file  Other Topics Concern   Not on file  Social History Narrative   Not on file   Social Drivers of Health   Financial Resource Strain: Not on file  Food Insecurity: No Food Insecurity (04/09/2023)   Hunger Vital Sign    Worried About Running Out of Food in the Last Year: Never true    Ran Out of Food in the Last Year: Never true  Transportation Needs: No Transportation Needs (04/09/2023)   PRAPARE - Administrator, Civil Service (Medical): No    Lack of Transportation (Non-Medical): No  Physical Activity: Not on file  Stress: Not on file  Social Connections: Socially Integrated (03/27/2023)   Social Connection and Isolation Panel    Frequency of Communication with Friends and Family: More than three times a week    Frequency of Social Gatherings with Friends and Family: Twice a week    Attends Religious Services: More than 4 times per year    Active Member of [employer] or Organizations: Yes    Attends Engineer, Structural: More than 4 times per year    Marital Status: Married     Family History: The patient's family history includes CAD in her maternal grandmother and mother; COPD in her mother; Diabetes in her maternal grandmother and mother; Heart attack in her mother; Stroke in her maternal grandmother and mother. ROS:   Please see the history of present illness.    All 14 point review of systems negative except as described per history of present illness  EKGs/Labs/Other Studies Reviewed:         Recent Labs: 03/15/2023: NT-Pro BNP 967 03/28/2023: TSH 0.119 05/21/2023: Hemoglobin 12.2; Platelet Count 236 05/24/2023: ALT 15; BUN 20; Creatinine, Ser 1.28; Potassium  5.2; Sodium 141  Recent Lipid Panel    Component Value Date/Time   CHOL 127 05/24/2023 0950   TRIG 101 05/24/2023 0950   HDL 61 05/24/2023 0950   CHOLHDL 2.1 05/24/2023 0950   LDLCALC 47 05/24/2023 0950    Physical Exam:    VS:  BP 110/60   Pulse 64   Ht 5' 2 (1.575 m)   Wt 163 lb (73.9 kg)   SpO2 95%   BMI 29.81 kg/m     Wt Readings from Last 3 Encounters:  12/29/23 163 lb (73.9 kg)  11/16/23 163 lb 1.6 oz (74 kg)  05/24/23 161 lb 3.2 oz (73.1 kg)     GEN:  Well nourished, well developed in no acute distress HEENT: Normal NECK: No JVD; No carotid bruits LYMPHATICS: No lymphadenopathy CARDIAC: RRR, no murmurs, no rubs, no gallops RESPIRATORY:  Clear to auscultation without rales, wheezing or rhonchi  ABDOMEN: Soft, non-tender, non-distended MUSCULOSKELETAL:  No edema; No deformity  SKIN: Warm and dry LOWER EXTREMITIES: no swelling NEUROLOGIC:  Alert and oriented x 3 PSYCHIATRIC:  Normal affect   ASSESSMENT:    1. Primary hypertension   2. Coronary artery disease involving native coronary artery of native heart without angina pectoris   3. Ischemic cardiomyopathy   4. Diabetes mellitus without complication (HCC)   5. ICD (implantable cardioverter-defibrillator) in place    PLAN:    In order of problems listed above:  Coronary disease stable from that continue on guideline directed medical therapy which I continue. LV thrombus, continue with Eliquis . Ischemic cardiomyopathy hemodynamically compensated doing well with scheduled to have echocardiogram done in January. Dyslipidemia on Lipitor 20 I will continue, I did review [facility] which show me her LDL of 47 HDL 61 this is from April of this year, continue present management ICD present Medtronic device no discharges   Medication Adjustments/Labs and Tests Ordered: Current medicines are reviewed at length with the patient today.  Concerns regarding medicines are outlined above.  Orders Placed This Encounter   Procedures   EKG 12-Lead   Medication changes: No orders of the defined types were placed in this encounter.   Signed, Lamar DOROTHA Fitch, MD, [facility] 12/29/2023 2:39 PM    [facility]

## 2024-01-18 ENCOUNTER — Ambulatory Visit: Payer: HMO

## 2024-01-18 DIAGNOSIS — I2510 Atherosclerotic heart disease of native coronary artery without angina pectoris: Secondary | ICD-10-CM

## 2024-01-19 LAB — CUP PACEART REMOTE DEVICE CHECK
Battery Remaining Longevity: 21 mo
Battery Voltage: 2.91 V
Brady Statistic AP VP Percent: 0 %
Brady Statistic AP VS Percent: 25.86 %
Brady Statistic AS VP Percent: 0 %
Brady Statistic AS VS Percent: 74.14 %
Brady Statistic RA Percent Paced: 25.45 %
Brady Statistic RV Percent Paced: 0 %
Date Time Interrogation Session: 20251202022604
HighPow Impedance: 43 Ohm
HighPow Impedance: 56 Ohm
Implantable Lead Connection Status: 753985
Implantable Lead Connection Status: 753985
Implantable Lead Implant Date: 20111212
Implantable Lead Implant Date: 20111212
Implantable Lead Location: 753859
Implantable Lead Location: 753860
Implantable Lead Model: 5076
Implantable Lead Model: 6947
Implantable Pulse Generator Implant Date: 20190826
Lead Channel Impedance Value: 304 Ohm
Lead Channel Impedance Value: 399 Ohm
Lead Channel Impedance Value: 399 Ohm
Lead Channel Pacing Threshold Amplitude: 1.5 V
Lead Channel Pacing Threshold Pulse Width: 0.4 ms
Lead Channel Sensing Intrinsic Amplitude: 1 mV
Lead Channel Sensing Intrinsic Amplitude: 1 mV
Lead Channel Sensing Intrinsic Amplitude: 5 mV
Lead Channel Sensing Intrinsic Amplitude: 5 mV
Lead Channel Setting Pacing Amplitude: 2.5 V
Lead Channel Setting Pacing Amplitude: 5 V
Lead Channel Setting Pacing Pulse Width: 1 ms
Lead Channel Setting Sensing Sensitivity: 0.3 mV
Zone Setting Status: 755011
Zone Setting Status: 755011

## 2024-01-20 ENCOUNTER — Ambulatory Visit: Payer: Self-pay | Admitting: Cardiology

## 2024-01-25 DIAGNOSIS — D509 Iron deficiency anemia, unspecified: Secondary | ICD-10-CM | POA: Diagnosis not present

## 2024-01-25 DIAGNOSIS — D126 Benign neoplasm of colon, unspecified: Secondary | ICD-10-CM | POA: Diagnosis not present

## 2024-01-25 NOTE — Progress Notes (Signed)
 Remote ICD Transmission

## 2024-02-22 ENCOUNTER — Ambulatory Visit

## 2024-03-15 ENCOUNTER — Ambulatory Visit: Attending: Cardiology

## 2024-03-15 DIAGNOSIS — R0609 Other forms of dyspnea: Secondary | ICD-10-CM | POA: Diagnosis not present

## 2024-03-15 LAB — ECHOCARDIOGRAM COMPLETE
AR max vel: 1.9 cm2
AV Area VTI: 2.02 cm2
AV Area mean vel: 1.83 cm2
AV Mean grad: 3 mmHg
AV Peak grad: 4.7 mmHg
Ao pk vel: 1.08 m/s
Area-P 1/2: 3.21 cm2
S' Lateral: 3.6 cm

## 2024-03-15 MED ORDER — PERFLUTREN LIPID MICROSPHERE
1.0000 mL | INTRAVENOUS | Status: AC | PRN
  Administered 2024-03-15: 10 mL via INTRAVENOUS

## 2024-03-17 ENCOUNTER — Ambulatory Visit: Payer: Self-pay | Admitting: Cardiology

## 2024-03-21 ENCOUNTER — Telehealth: Payer: Self-pay

## 2024-03-21 NOTE — Telephone Encounter (Signed)
 Echo Results reviewed with pt as per Dr. Vanetta Shawl note.  Pt verbalized understanding and had no additional questions. Routed to PCP

## 2024-03-21 NOTE — Telephone Encounter (Signed)
LVM to call regarding Echo results
